# Patient Record
Sex: Female | Born: 2000 | Race: Black or African American | Hispanic: No | Marital: Single | Smoking: Former smoker
Health system: Southern US, Community
[De-identification: ages and names within clinical notes are randomized; demographics above are authoritative.]

## PROBLEM LIST (undated history)

## (undated) ENCOUNTER — Inpatient Hospital Stay (HOSPITAL_COMMUNITY): Payer: Self-pay

## (undated) ENCOUNTER — Emergency Department (HOSPITAL_COMMUNITY): Admission: EM | Payer: Medicaid Other | Source: Home / Self Care

## (undated) DIAGNOSIS — F329 Major depressive disorder, single episode, unspecified: Secondary | ICD-10-CM

## (undated) DIAGNOSIS — R87629 Unspecified abnormal cytological findings in specimens from vagina: Secondary | ICD-10-CM

## (undated) DIAGNOSIS — F32A Depression, unspecified: Secondary | ICD-10-CM

## (undated) HISTORY — PX: NO PAST SURGERIES: SHX2092

---
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
## 2018-05-09 ENCOUNTER — Other Ambulatory Visit: Payer: Self-pay

## 2018-05-09 ENCOUNTER — Encounter (HOSPITAL_COMMUNITY): Payer: Self-pay | Admitting: *Deleted

## 2018-05-09 ENCOUNTER — Inpatient Hospital Stay (HOSPITAL_COMMUNITY)
Admission: AD | Admit: 2018-05-09 | Discharge: 2018-05-09 | Disposition: A | Discharge status: Home / Self Care | Payer: Medicaid Other | Source: Ambulatory Visit | Attending: Family Medicine | Admitting: Family Medicine

## 2018-05-09 DIAGNOSIS — R309 Painful micturition, unspecified: Secondary | ICD-10-CM | POA: Diagnosis present

## 2018-05-09 DIAGNOSIS — N3001 Acute cystitis with hematuria: Secondary | ICD-10-CM | POA: Diagnosis not present

## 2018-05-09 DIAGNOSIS — Z87891 Personal history of nicotine dependence: Secondary | ICD-10-CM | POA: Diagnosis not present

## 2018-05-09 DIAGNOSIS — R112 Nausea with vomiting, unspecified: Secondary | ICD-10-CM | POA: Diagnosis not present

## 2018-05-09 LAB — URINALYSIS, ROUTINE W REFLEX MICROSCOPIC
Bilirubin Urine: NEGATIVE
GLUCOSE, UA: NEGATIVE mg/dL
KETONES UR: 20 mg/dL — AB
Nitrite: NEGATIVE
PROTEIN: 30 mg/dL — AB
Specific Gravity, Urine: 1.031 — ABNORMAL HIGH (ref 1.005–1.030)
pH: 5 (ref 5.0–8.0)

## 2018-05-09 LAB — POCT PREGNANCY, URINE: Preg Test, Ur: NEGATIVE

## 2018-05-09 MED ORDER — NITROFURANTOIN MONOHYD MACRO 100 MG PO CAPS: 100.0000 mg | ORAL_CAPSULE | Freq: Two times a day (BID) | ORAL | 0 refills | Status: DC

## 2018-05-09 MED ORDER — PHENAZOPYRIDINE HCL 200 MG PO TABS: 200.0000 mg | ORAL_TABLET | Freq: Three times a day (TID) | ORAL | 0 refills | Status: DC

## 2018-05-09 NOTE — Discharge Instructions (Signed)
In late 2019, the [facility] be [facility] to [facility]. At that time, the MAU (Maternity Admissions Unit), where you are being seen today, will no longer take care of non-pregnant patients. We strongly encourage you to find a doctor's office before that time, so that you can be seen with any GYN concerns, like vaginal discharge, urinary tract infection, etc.. in a timely manner. ° °In order to make an office visit more convenient, the [facility] will be offering evening hours with same-day appointments, walk-in appointments and scheduled appointments available during this time. ° °[facility] Hours: °Monday - 8am - 7:30 pm with walk-in between 4pm- 7:30 pm °Tuesday - 8 am - 5 pm (starting 07/31/17 we will be open late and accepting walk-ins from 4pm - 7:30pm) °Wednesday - 8 am - 5 pm (starting 10/31/17 we will be open late and accepting walk-ins from 4pm - 7:30pm) °Thursday 8 am - 5 pm (starting 01/31/18 we will be open late and accepting walk-ins from 4pm - 7:30pm) °Friday 8 am - 5 pm ° °For an appointment please call the [facility] at [phone] ° °For urgent needs, [facility] is also available for management of urgent GYN complaints such as vaginal discharge or urinary tract infections. ° ° ° ° ° °Urinary Tract Infection, Adult ° °A urinary tract infection (UTI) is an infection of any part of the urinary tract. The urinary tract includes the kidneys, ureters, bladder, and urethra. These organs make, store, and get rid of urine in the body. °Your health care provider may use other names to describe the infection. An upper UTI affects the ureters and kidneys (pyelonephritis). A lower UTI affects the bladder (cystitis) and urethra (urethritis). °What are the causes? °Most urinary tract infections are caused by bacteria in your genital area, around the entrance to your urinary  tract (urethra). These bacteria grow and cause inflammation of your urinary tract. °What increases the risk? °You are more likely to develop this condition if: °· You have a urinary catheter that stays in place (indwelling). °· You are not able to control when you urinate or have a bowel movement (you have incontinence). °· You are female and you: °? Use a spermicide or diaphragm for birth control. °? Have low estrogen levels. °? Are pregnant. °· You have certain genes that increase your risk (genetics). °· You are sexually active. °· You take antibiotic medicines. °· You have a condition that causes your flow of urine to slow down, such as: °? An enlarged prostate, if you are female. °? Blockage in your urethra (stricture). °? A kidney stone. °? A nerve condition that affects your bladder control (neurogenic bladder). °? Not getting enough to drink, or not urinating often. °· You have certain medical conditions, such as: °? Diabetes. °? A weak disease-fighting system (immunesystem). °? Sickle cell disease. °? Gout. °? Spinal cord injury. °What are the signs or symptoms? °Symptoms of this condition include: °· Needing to urinate right away (urgently). °· Frequent urination or passing small amounts of urine frequently. °· Pain or burning with urination. °· Blood in the urine. °· Urine that smells bad or unusual. °· Trouble urinating. °· Cloudy urine. °· Vaginal discharge, if you are female. °· Pain in the abdomen or the lower back. °You may also have: °· Vomiting or a decreased appetite. °· Confusion. °· Irritability or tiredness. °· A fever. °·   Diarrhea. °The first symptom in older adults may be confusion. In some cases, they may not have any symptoms until the infection has worsened. °How is this diagnosed? °This condition is diagnosed based on your medical history and a physical exam. You may also have other tests, including: °· Urine tests. °· Blood tests. °· Tests for sexually transmitted infections (STIs). °If  you have had more than one UTI, a cystoscopy or imaging studies may be done to determine the cause of the infections. °How is this treated? °Treatment for this condition includes: °· Antibiotic medicine. °· Over-the-counter medicines to treat discomfort. °· Drinking enough water to stay hydrated. °If you have frequent infections or have other conditions such as a kidney stone, you may need to see a health care provider who specializes in the urinary tract (urologist). °In rare cases, urinary tract infections can cause sepsis. Sepsis is a life-threatening condition that occurs when the body responds to an infection. Sepsis is treated in the hospital with IV antibiotics, fluids, and other medicines. °Follow these instructions at home: ° °Medicines °· Take over-the-counter and prescription medicines only as told by your health care provider. °· If you were prescribed an antibiotic medicine, take it as told by your health care provider. Do not stop using the antibiotic even if you start to feel better. °General instructions °· Make sure you: °? Empty your bladder often and completely. Do not hold urine for long periods of time. °? Empty your bladder after sex. °? Wipe from front to back after a bowel movement if you are female. Use each tissue one time when you wipe. °· Drink enough fluid to keep your urine pale yellow. °· Keep all follow-up visits as told by your health care provider. This is important. °Contact a health care provider if: °· Your symptoms do not get better after 1-2 days. °· Your symptoms go away and then return. °Get help right away if you have: °· Severe pain in your back or your lower abdomen. °· A fever. °· Nausea or vomiting. °Summary °· A urinary tract infection (UTI) is an infection of any part of the urinary tract, which includes the kidneys, ureters, bladder, and urethra. °· Most urinary tract infections are caused by bacteria in your genital area, around the entrance to your urinary tract  (urethra). °· Treatment for this condition often includes antibiotic medicines. °· If you were prescribed an antibiotic medicine, take it as told by your health care provider. Do not stop using the antibiotic even if you start to feel better. °· Keep all follow-up visits as told by your health care provider. This is important. °This information is not intended to replace advice given to you by your health care provider. Make sure you discuss any questions you have with your health care provider. °Document Released: 01/25/2005 Document Revised: 10/25/2017 Document Reviewed: 10/25/2017 °Elsevier Interactive Patient Education © 2019 Elsevier Inc[employer] ° ° °

## 2018-05-09 NOTE — MAU Note (Signed)
Pt states she thinks she has a uti. Pt states every time she goes to the bathroom she experiences burning.   Pt states she started vomiting 2 days ago.   Pt reports nausea and losing her appetite.   Pt reports fever yesterday, but not today.

## 2018-05-09 NOTE — MAU Provider Note (Signed)
History     CSN: [id number]  Arrival date and time: 05/09/18 1606   First Provider Initiated Contact with Patient 05/09/18 1847      Chief Complaint  Patient presents with  . Urinary Tract Infection   HPI Nichole Finley is a 18 y.o. G1P1001 non pregnant female who presents with burning with urination. She states it has been ongoing for 2 days. She denies any vaginal bleeding or discharge. She also reports intermittent nausea and vomiting.  OB History    Gravida  1   Para  1   Term  1   Preterm      AB      Living  1     SAB      TAB      Ectopic      Multiple      Live Births  1           Past Medical History:  Diagnosis Date  . Depression   . Vaginal Pap smear, abnormal     Past Surgical History:  Procedure Laterality Date  . NO PAST SURGERIES      Family History  Problem Relation Age of Onset  . Heart disease Maternal Uncle   . Heart disease Maternal Grandfather     Social History   Tobacco Use  . Smoking status: Former Smoker    Last attempt to quit: 05/23/2017    Years since quitting: 0.9  . Smokeless tobacco: Never Used  . Tobacco comment: before pregnancy  Substance Use Topics  . Alcohol use: No  . Drug use: No    Allergies: No Known Allergies  Medications Prior to Admission  Medication Sig Dispense Refill Last Dose  . FLUoxetine (PROZAC) 10 MG tablet Take 1 tablet (10 mg total) by mouth daily. 30 tablet 0   . Prenatal Vit-Fe Fumarate-FA (PRENATAL MULTIVITAMIN) TABS tablet Take 1 tablet by mouth daily at 12 noon.   Past Week at Unknown time    Review of Systems  Constitutional: Negative.  Negative for fatigue and fever.  HENT: Negative.   Respiratory: Negative.  Negative for shortness of breath.   Cardiovascular: Negative.  Negative for chest pain.  Gastrointestinal: Positive for nausea and vomiting. Negative for abdominal pain, constipation and diarrhea.  Genitourinary: Positive for dysuria. Negative for vaginal bleeding and  vaginal discharge.  Neurological: Negative.  Negative for dizziness and headaches.   Physical Exam   Blood pressure 118/65, pulse 77, temperature 98.5 F (36.9 C), resp. rate 18, weight 88.5 kg, SpO2 99 %, unknown if currently breastfeeding.  Physical Exam  Nursing note and vitals reviewed. Constitutional: She is oriented to person, place, and time. She appears well-developed and well-nourished. No distress.  HENT:  Head: Normocephalic.  Eyes: Pupils are equal, round, and reactive to light.  Cardiovascular: Normal rate, regular rhythm and normal heart sounds.  Respiratory: Effort normal and breath sounds normal. No respiratory distress.  GI: Soft. Bowel sounds are normal. She exhibits no distension. There is no abdominal tenderness.  Neurological: She is alert and oriented to person, place, and time.  Skin: Skin is warm and dry.  Psychiatric: She has a normal mood and affect. Her behavior is normal. Judgment and thought content normal.    MAU Course  Procedures Results for orders placed or performed during the hospital encounter of 05/09/18 (from the past 24 hour(s))  Urinalysis, Routine w reflex microscopic     Status: Abnormal   Collection Time: 05/09/18  5:03 PM  Result Value Ref Range   Color, Urine AMBER (A) YELLOW   APPearance HAZY (A) CLEAR   Specific Gravity, Urine 1.031 (H) 1.005 - 1.030   pH 5.0 5.0 - 8.0   Glucose, UA NEGATIVE NEGATIVE mg/dL   Hgb urine dipstick SMALL (A) NEGATIVE   Bilirubin Urine NEGATIVE NEGATIVE   Ketones, ur 20 (A) NEGATIVE mg/dL   Protein, ur 30 (A) NEGATIVE mg/dL   Nitrite NEGATIVE NEGATIVE   Leukocytes, UA MODERATE (A) NEGATIVE   RBC / HPF 6-10 0 - 5 RBC/hpf   WBC, UA 21-50 0 - 5 WBC/hpf   Bacteria, UA RARE (A) NONE SEEN   Squamous Epithelial / LPF 0-5 0 - 5   Mucus PRESENT   Pregnancy, urine POC     Status: None   Collection Time: 05/09/18  5:07 PM  Result Value Ref Range   Preg Test, Ur NEGATIVE NEGATIVE   MDM UA, UPT No  episodes of vomiting in MAU  Offered patient pelvic exam and screening for STDs to evaluate causes of pain- patient declined  Assessment and Plan   1. Acute cystitis with hematuria    -Discharge home in stable condition -Rx for macrobid and pyridium sent to patient's pharmacy -Pyelonephritis precautions discussed -Patient advised to follow-up with gyn of choice for routine gyn care -Patient may return to MAU as needed or if her condition were to change or worsen  Rolm Bookbinder CNM 05/09/2018, 6:47 PM

## 2018-11-04 ENCOUNTER — Ambulatory Visit
Admission: EM | Admit: 2018-11-04 | Discharge: 2018-11-04 | Disposition: A | Discharge status: Home / Self Care | Payer: Medicaid Other | Attending: Physician Assistant | Admitting: Physician Assistant

## 2018-11-04 ENCOUNTER — Other Ambulatory Visit: Payer: Self-pay

## 2018-11-04 DIAGNOSIS — L02412 Cutaneous abscess of left axilla: Secondary | ICD-10-CM | POA: Diagnosis not present

## 2018-11-04 MED ORDER — CEPHALEXIN 500 MG PO CAPS: 500.0000 mg | ORAL_CAPSULE | Freq: Four times a day (QID) | ORAL | 0 refills | Status: DC

## 2018-11-04 NOTE — ED Triage Notes (Signed)
Pt c/o an abscess under lt arm x1wk

## 2018-11-04 NOTE — Discharge Instructions (Signed)
Start Keflex as directed. You can remove current dressing in 24 hours. Keep wound clean and dry. You can clean gently with soap and water. Do not soak area in water. Warm compress for at least 15-20 mins each time. Monitor for spreading redness, increased warmth, increased swelling, fever, follow up for reevaluation needed.

## 2018-11-04 NOTE — ED Provider Notes (Signed)
[facility]    CSN: [id number] Arrival date & time: 11/04/18  1611     History   Chief Complaint Chief Complaint  Patient presents with  . Abscess    HPI Nichole Finley is a 18 y.o. female.   18 year old female comes in for 1 week history of left axilla abscess. Abscess has been gradually enlarging without surrounding erythema, warmth. Denies fever, chills, night sweats. Does shave the area. Has tried warm compress and tried to apply salt. Denies history of same.      Past Medical History:  Diagnosis Date  . Depression   . Vaginal Pap smear, abnormal     Patient Active Problem List   Diagnosis Date Noted  . 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 Severe single current episode of major depressive disorder, without psychotic features ([place])   . MDD (major depressive disorder), single episode, moderate ([place]) 10/06/2014  . Attention deficit hyperactivity disorder (ADHD), combined type, severe 10/06/2014  . Oppositional defiant disorder 10/06/2014    Past Surgical History:  Procedure Laterality Date  . NO PAST SURGERIES      OB History    Gravida  1   Para  1   Term  1   Preterm      AB      Living  1     SAB      TAB      Ectopic      Multiple      Live Births  1            Home Medications    Prior to Admission medications   Medication Sig Start Date End Date Taking? Authorizing Provider  cephALEXin (KEFLEX) 500 MG capsule Take 1 capsule (500 mg total) by mouth 4 (four) times daily. 11/04/18   Ok Edwards, PA-C    Family History Family History  Problem Relation Age of Onset  . Heart disease Maternal Uncle   . Heart disease Maternal Grandfather     Social History Social History   Tobacco Use  . Smoking status: Former Smoker    Quit date: 05/23/2017    Years since quitting: 1.4  . Smokeless tobacco: Never Used  . Tobacco comment: before pregnancy  Substance Use Topics  . Alcohol use: No  . Drug use: No      Allergies   Patient has no known allergies.   Review of Systems Review of Systems  Reason unable to perform ROS: See HPI as above.     Physical Exam Triage Vital Signs ED Triage Vitals  Enc Vitals Group     BP 11/04/18 1618 124/75     Pulse Rate 11/04/18 1618 80     Resp 11/04/18 1618 18     Temp 11/04/18 1618 98.2 F (36.8 C)     Temp Source 11/04/18 1618 Oral     SpO2 11/04/18 1618 98 %     Weight --      Height --      Head Circumference --      Peak Flow --      Pain Score 11/04/18 1619 9     Pain Loc --      Pain Edu? --      Excl. in [place]? --    No data found.  Updated Vital Signs BP 124/75 (BP Location: Left Arm)   Pulse 80   Temp 98.2 F (36.8 C) (Oral)   Resp 18   LMP  10/12/2018   SpO2 98%   Physical Exam Constitutional:      General: She is not in acute distress.    Appearance: She is well-developed. She is not diaphoretic.  HENT:     Head: Normocephalic and atraumatic.  Eyes:     Conjunctiva/sclera: Conjunctivae normal.     Pupils: Pupils are equal, round, and reactive to light.  Skin:    General: Skin is warm and dry.     Comments: 4cm x 4cm abscess without surrounding cellulitis. Fluctuance felt. No induration. Tenderness to palpation.   Neurological:     Mental Status: She is alert and oriented to person, place, and time.    UC Treatments / Results  Labs (all labs ordered are listed, but only abnormal results are displayed) Labs Reviewed - No data to display  EKG   Radiology No results found.  Procedures Incision and Drainage  Date/Time: 11/04/2018 5:50 PM Performed by: Belinda FisherYu, Avia Merkley V, PA-C Authorized by: Belinda FisherYu, Reganne Messerschmidt V, PA-C   Consent:    Consent obtained:  Verbal   Consent given by:  Patient   Risks discussed:  Bleeding, incomplete drainage, pain, infection and damage to other organs   Alternatives discussed:  Alternative treatment and referral Location:    Type:  Abscess   Size:  4cm x 4cm   Location:  Upper extremity   Upper  extremity location: axilla. Pre-procedure details:    Skin preparation:  Hibiclens Anesthesia (see MAR for exact dosages):    Anesthesia method:  Local infiltration   Local anesthetic:  Lidocaine 2% w/o epi Procedure type:    Complexity:  Simple Procedure details:    Needle aspiration: no     Incision types:  Single straight   Incision depth:  Subcutaneous   Scalpel blade:  11   Wound management:  Probed and deloculated and irrigated with saline   Drainage:  Purulent   Drainage amount:  Copious   Wound treatment:  Wound left open   Packing materials:  None Post-procedure details:    Patient tolerance of procedure:  Tolerated well, no immediate complications   (including critical care time)  Medications Ordered in UC Medications - No data to display  Initial Impression / Assessment and Plan / UC Course  I have reviewed the triage vital signs and the nursing notes.  Pertinent labs & imaging results that were available during my care of the patient were reviewed by me and considered in my medical decision making (see chart for details).    Patient tolerated procedure well. Though no surrounding cellulitis, still with significant swelling with ? of incomplete drainage. Will cover with keflex. Wound care instructions given. Return precautions given. Patient expresses understanding and agrees to plan.   Final Clinical Impressions(s) / UC Diagnoses   Final diagnoses:  Abscess of left axilla    ED Prescriptions    Medication Sig Dispense Auth. Provider   cephALEXin (KEFLEX) 500 MG capsule Take 1 capsule (500 mg total) by mouth 4 (four) times daily. 28 capsule Threasa AlphaYu, Ryatt Corsino V, PA-C        Shayra Anton V, [place]PA-C 11/04/18 1752

## 2019-03-04 ENCOUNTER — Ambulatory Visit
Admission: EM | Admit: 2019-03-04 | Discharge: 2019-03-04 | Disposition: A | Discharge status: Home / Self Care | Payer: Medicaid Other | Attending: Emergency Medicine | Admitting: Emergency Medicine

## 2019-03-04 ENCOUNTER — Encounter: Payer: Self-pay | Admitting: Emergency Medicine

## 2019-03-04 ENCOUNTER — Other Ambulatory Visit: Payer: Self-pay

## 2019-03-04 DIAGNOSIS — R300 Dysuria: Secondary | ICD-10-CM | POA: Insufficient documentation

## 2019-03-04 DIAGNOSIS — Z8619 Personal history of other infectious and parasitic diseases: Secondary | ICD-10-CM | POA: Insufficient documentation

## 2019-03-04 LAB — POCT URINALYSIS DIP (MANUAL ENTRY)
Bilirubin, UA: NEGATIVE
Blood, UA: NEGATIVE
Glucose, UA: NEGATIVE mg/dL
Ketones, POC UA: NEGATIVE mg/dL
Leukocytes, UA: NEGATIVE
Nitrite, UA: NEGATIVE
Spec Grav, UA: >=1.03 — AB (ref 1.010–1.025)
Urobilinogen, UA: 1 E.U./dL
pH, UA: 6 (ref 5.0–8.0)

## 2019-03-04 LAB — POCT URINE PREGNANCY: Preg Test, Ur: NEGATIVE

## 2019-03-04 NOTE — ED Provider Notes (Signed)
[facility]    CSN: [id number] Arrival date & time: 03/04/19  1358      History   Chief Complaint Chief Complaint  Patient presents with  . APPT: 2:00  . Dysuria    HPI Nichole Finley is a 18 y.o. female presenting for burning with urination for the last 2 days that is intermittent.  Has not tried thing for symptoms.  Of note, patient was treated for trichomonas 2 months ago, though her boyfriend never seek treatment.  States her boyfriend is getting tested/treated today.  Patient denied vaginal discharge, irritation, pelvic pain, fever.  Patient does endorse history of renal calculi: Denies back pain, change in urinary habit, hematuria.    Past Medical History:  Diagnosis Date  . Depression   . Vaginal Pap smear, abnormal     Patient Active Problem List   Diagnosis Date Noted  . 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 Severe single current episode of major depressive disorder, without psychotic features ([place])   . MDD (major depressive disorder), single episode, moderate ([place]) 10/06/2014  . Attention deficit hyperactivity disorder (ADHD), combined type, severe 10/06/2014  . Oppositional defiant disorder 10/06/2014    Past Surgical History:  Procedure Laterality Date  . NO PAST SURGERIES      OB History    Gravida  1   Para  1   Term  1   Preterm      AB      Living  1     SAB      TAB      Ectopic      Multiple      Live Births  1            Home Medications    Prior to Admission medications   Not on File    Family History Family History  Problem Relation Age of Onset  . Heart disease Maternal Uncle   . Heart disease Maternal Grandfather     Social History Social History   Tobacco Use  . Smoking status: Former Smoker    Quit date: 05/23/2017    Years since quitting: 1.7  . Smokeless tobacco: Never Used  . Tobacco comment: before pregnancy  Substance Use Topics  . Alcohol use: No  . Drug use: No      Allergies   Patient has no known allergies.   Review of Systems Review of Systems  Constitutional: Negative for fatigue and fever.  Respiratory: Negative for cough and shortness of breath.   Cardiovascular: Negative for chest pain and palpitations.  Gastrointestinal: Negative for constipation and diarrhea.  Genitourinary: Positive for dysuria. Negative for flank pain, frequency, hematuria, pelvic pain, urgency, vaginal bleeding, vaginal discharge and vaginal pain.     Physical Exam Triage Vital Signs ED Triage Vitals  Enc Vitals Group     BP 03/04/19 1404 118/68     Pulse Rate 03/04/19 1404 85     Resp 03/04/19 1404 18     Temp 03/04/19 1404 98.4 F (36.9 C)     Temp Source 03/04/19 1404 Temporal     SpO2 03/04/19 1404 98 %     Weight --      Height --      Head Circumference --      Peak Flow --      Pain Score 03/04/19 1406 0     Pain Loc --      Pain Edu? --      Excl.  in GC? --    No data found.  Updated Vital Signs BP 118/68 (BP Location: Left Arm)   Pulse 85   Temp 98.4 F (36.9 C) (Temporal)   Resp 18   LMP 02/18/2019   SpO2 98%   Visual Acuity Right Eye Distance:   Left Eye Distance:   Bilateral Distance:    Right Eye Near:   Left Eye Near:    Bilateral Near:     Physical Exam Constitutional:      General: She is not in acute distress. HENT:     Head: Normocephalic and atraumatic.  Eyes:     General: No scleral icterus.    Pupils: Pupils are equal, round, and reactive to light.  Cardiovascular:     Rate and Rhythm: Normal rate.  Pulmonary:     Effort: Pulmonary effort is normal.  Abdominal:     General: Bowel sounds are normal.     Palpations: Abdomen is soft.     Tenderness: There is no abdominal tenderness. There is no right CVA tenderness, left CVA tenderness or guarding.  Genitourinary:    Comments: Patient declined, self-swab performed Skin:    Coloration: Skin is not jaundiced or pale.  Neurological:     Mental Status: She is  alert and oriented to person, place, and time.      UC Treatments / Results  Labs (all labs ordered are listed, but only abnormal results are displayed) Labs Reviewed  POCT URINALYSIS DIP (MANUAL ENTRY) - Abnormal; Notable for the following components:      Result Value   Spec Grav, UA >=1.030 (*)    Protein Ur, POC trace (*)    All other components within normal limits  POCT URINE PREGNANCY  CERVICOVAGINAL ANCILLARY ONLY    EKG   Radiology No results found.  Procedures Procedures (including critical care time)  Medications Ordered in UC Medications - No data to display  Initial Impression / Assessment and Plan / UC Course  I have reviewed the triage vital signs and the nursing notes.  Pertinent labs & imaging results that were available during my care of the patient were reviewed by me and considered in my medical decision making (see chart for details).     STD testing pending: We will defer treatment as patient is already completed treatment, partner asymptomatic, and she was recently treated.  Urinalysis done in office, reviewed me at time of appointment: Positive for trace protein, otherwise unremarkable.  Culture deferred.  Reviewed findings with patient who verbalized understanding.  Will increase oral hydration, monitor symptoms.  Return precautions discussed, patient verbalized understanding and is agreeable to plan. Final Clinical Impressions(s) / UC Diagnoses   Final diagnoses:  History of trichomoniasis  Dysuria     Discharge Instructions     No evidence of UTI today. We will call you with results of your STD tests, and treat.  Important to avoid all forms of sexual intercourse (oral, vaginal, anal) with any/all partners for the next 7 days to avoid spreading/reinfecting. Any/all sexual partners should be notified of testing/treatment today.  Return for persistent/worsening symptoms or if you develop fever, abdominal or pelvic pain, blood in your  urine, or are re-exposed to an STI.    ED Prescriptions    None     PDMP not reviewed this encounter.   Hall[place], [place], [place] 03/04/19 1451

## 2019-03-04 NOTE — ED Notes (Signed)
Patient able to ambulate independently  

## 2019-03-04 NOTE — ED Triage Notes (Signed)
Pt presents to [facility] for assessment of burning with urination x 2 days.  Also states she tested positive for Trich 2 months ago, and was treated, but her boyfriend never got treated, so he is getting tested and treated today as well.

## 2019-03-04 NOTE — Discharge Instructions (Signed)
No evidence of UTI today. We will call you with results of your STD tests, and treat.  Important to avoid all forms of sexual intercourse (oral, vaginal, anal) with any/all partners for the next 7 days to avoid spreading/reinfecting. Any/all sexual partners should be notified of testing/treatment today.  Return for persistent/worsening symptoms or if you develop fever, abdominal or pelvic pain, blood in your urine, or are re-exposed to an STI.

## 2019-03-06 LAB — CERVICOVAGINAL ANCILLARY ONLY
Chlamydia: NEGATIVE
Neisseria Gonorrhea: NEGATIVE
Trichomonas: NEGATIVE

## 2019-04-15 IMAGING — US US OB TRANSVAGINAL
1 series · 15 of 28 positions shown · non-contrast
Comparison: 02/23/2017

CLINICAL DATA: Pregnant, inconclusive viability

EXAM:
TRANSVAGINAL OB ULTRASOUND
TECHNIQUE: Transvaginal ultrasound was performed for complete evaluation of the
gestation as well as the maternal uterus, adnexal regions, and
pelvic cul-de-sac.

[Series 1: us ob transvaginal · 65 acquisitions, 15 frames shown]
[im 1/65]
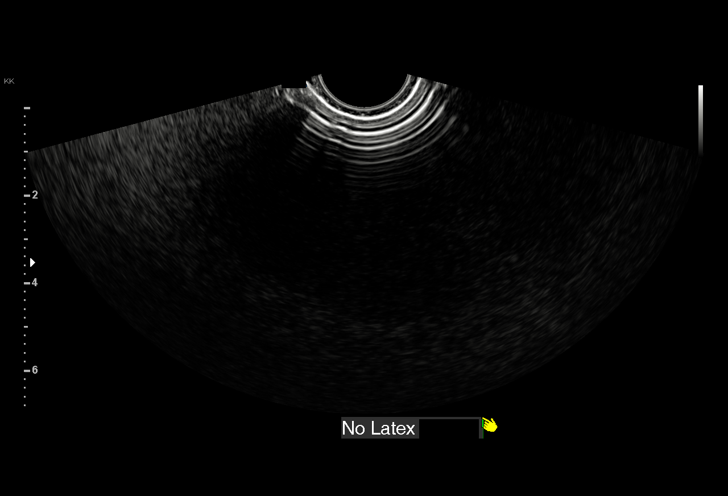
[im 5/65]
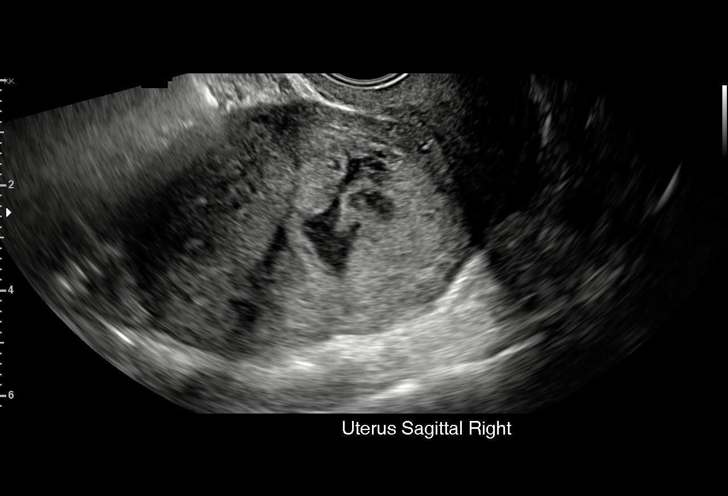
[im 10/65]
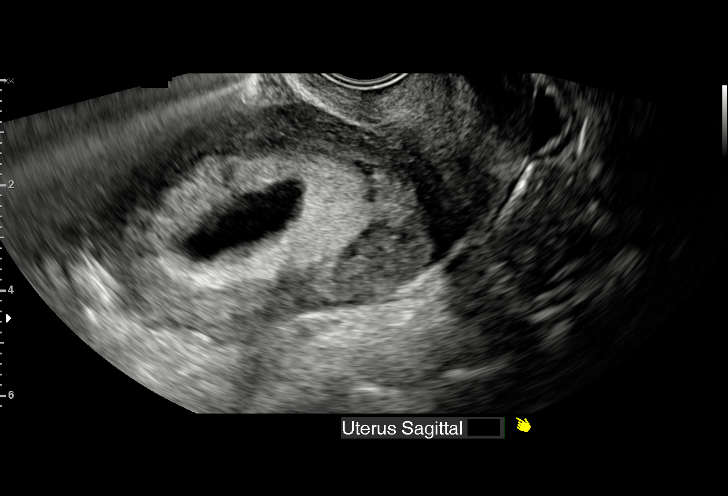
[im 15/65]
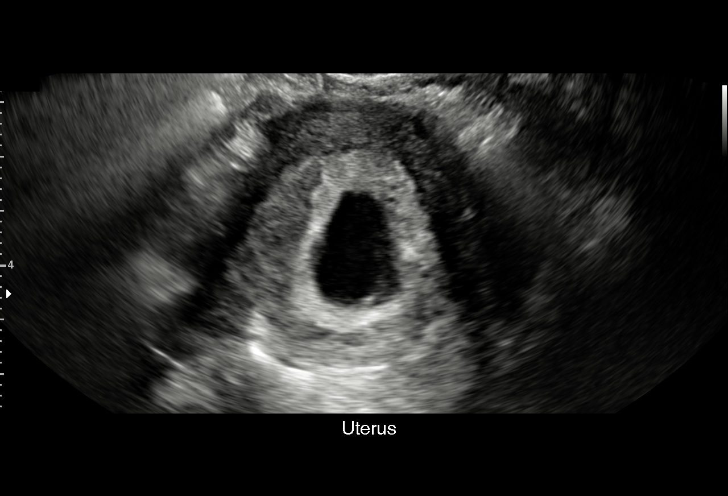
[im 19/65]
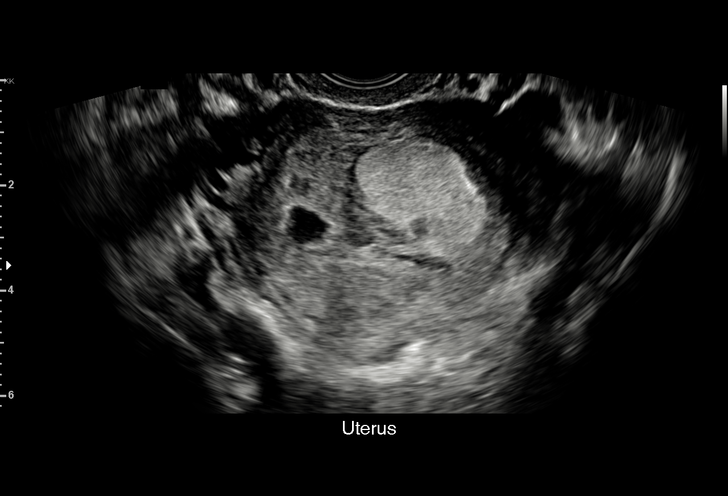
[im 24/65]
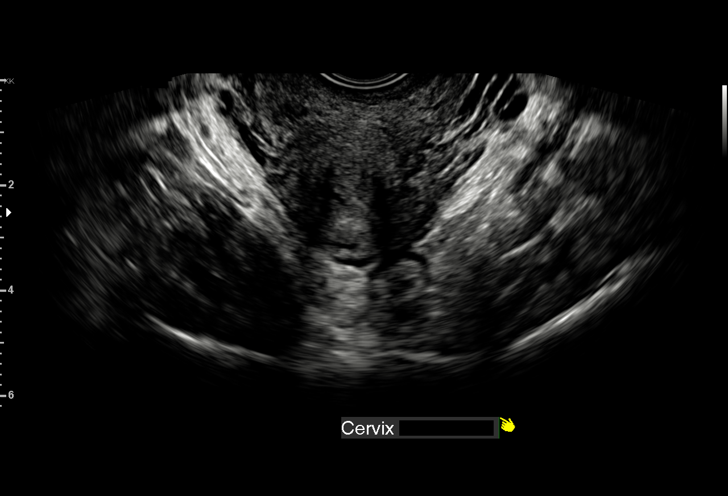
[im 29/65]
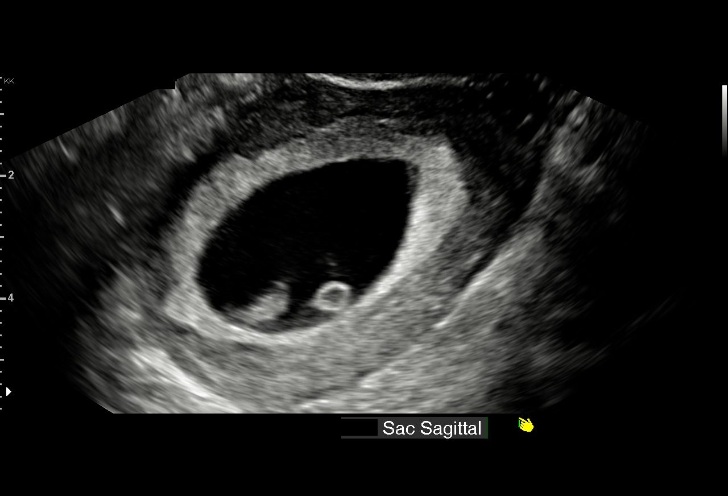
[im 34/65]
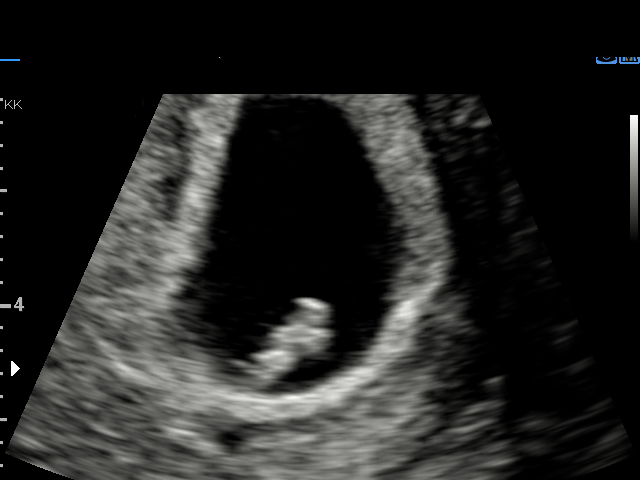
[im 36/65]
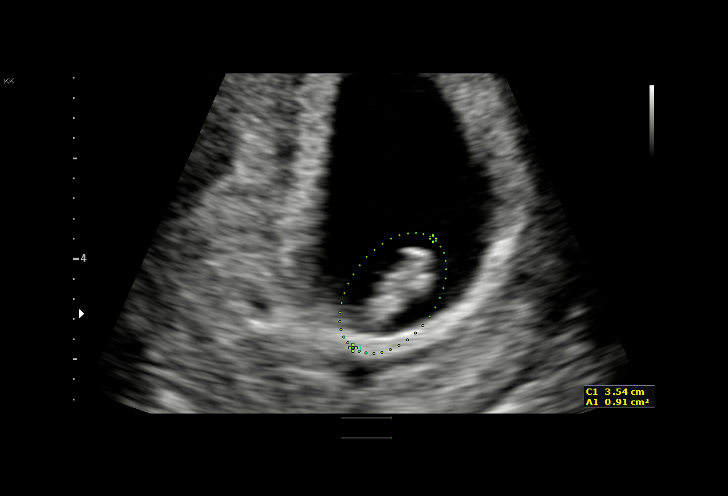
[im 41/65]
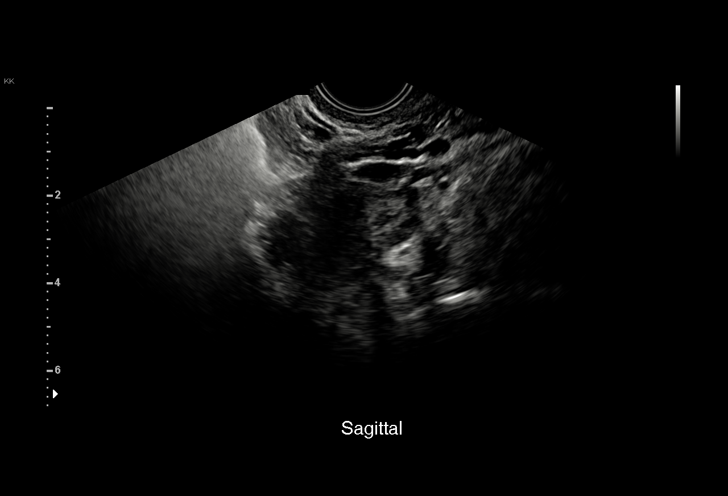
[im 46/65]
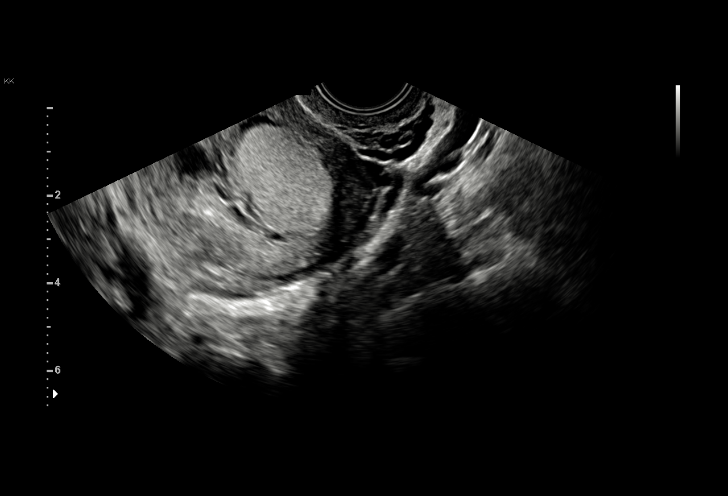
[im 50/65]
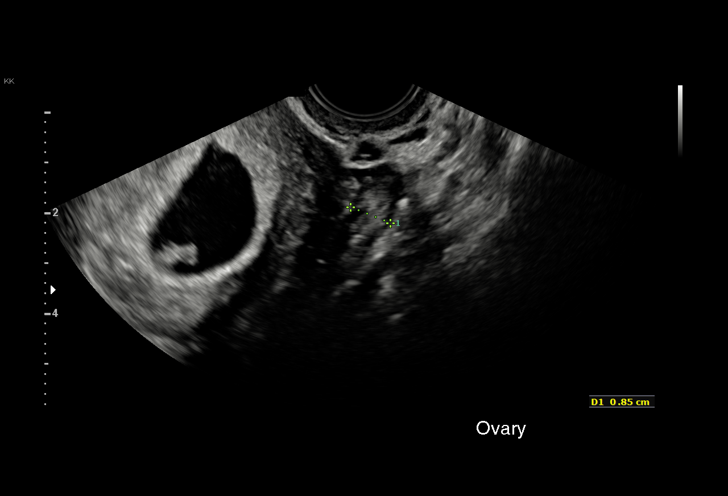
[im 55/65]
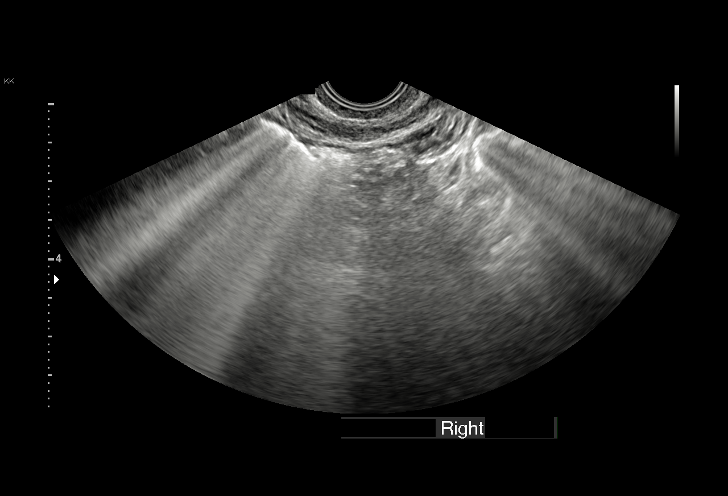
[im 60/65]
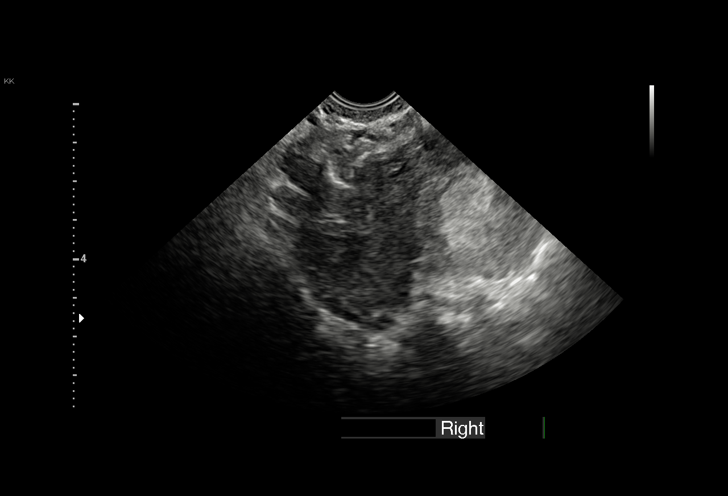
[im 65/65]
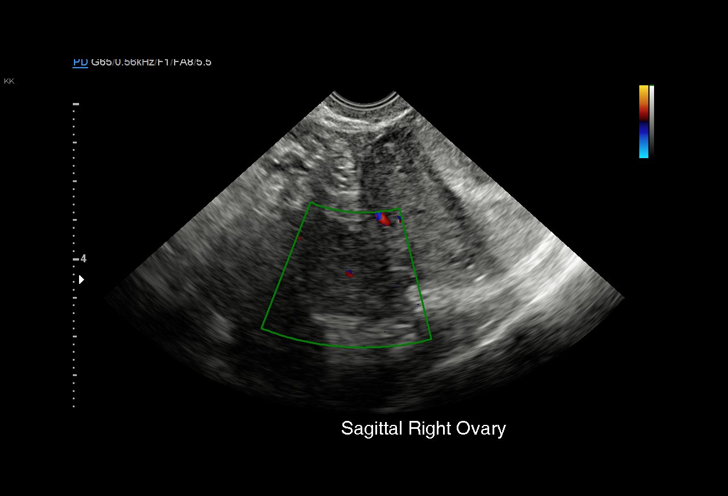

[15 of 28 positions shown; findings below may reference images not displayed]

FINDINGS: Intrauterine gestational sac: Single

Yolk sac:  Visualized.

Embryo:  Visualized.

Cardiac Activity: Visualized.

Heart Rate: 138 bpm

CRL:   10.2  mm   7 w 1 d                  US EDC: 10/29/2017

Subchorionic hemorrhage:  Large subchronic hemorrhage.

Maternal uterus/adnexae: Bilateral ovaries are within normal limits.

Trace pelvic fluid.
IMPRESSION: Single live intrauterine gestation, measuring 7 weeks 1 day by
crown-rump length, as described above.

Large subchronic hemorrhage.

## 2019-07-01 ENCOUNTER — Encounter (HOSPITAL_COMMUNITY): Payer: Self-pay

## 2019-07-01 ENCOUNTER — Emergency Department (HOSPITAL_COMMUNITY)
Admission: EM | Admit: 2019-07-01 | Discharge: 2019-07-02 | Disposition: A | Discharge status: Home / Self Care | Payer: Medicaid Other | Attending: Emergency Medicine | Admitting: Emergency Medicine

## 2019-07-01 ENCOUNTER — Emergency Department (HOSPITAL_COMMUNITY): Payer: Medicaid Other

## 2019-07-01 ENCOUNTER — Other Ambulatory Visit: Payer: Self-pay

## 2019-07-01 DIAGNOSIS — S20319A Abrasion of unspecified front wall of thorax, initial encounter: Secondary | ICD-10-CM | POA: Diagnosis not present

## 2019-07-01 DIAGNOSIS — Y93I9 Activity, other involving external motion: Secondary | ICD-10-CM | POA: Diagnosis not present

## 2019-07-01 DIAGNOSIS — Y999 Unspecified external cause status: Secondary | ICD-10-CM | POA: Insufficient documentation

## 2019-07-01 DIAGNOSIS — Z87891 Personal history of nicotine dependence: Secondary | ICD-10-CM | POA: Insufficient documentation

## 2019-07-01 DIAGNOSIS — Y92410 Unspecified street and highway as the place of occurrence of the external cause: Secondary | ICD-10-CM | POA: Insufficient documentation

## 2019-07-01 DIAGNOSIS — S299XXA Unspecified injury of thorax, initial encounter: Secondary | ICD-10-CM | POA: Diagnosis present

## 2019-07-01 LAB — PREGNANCY, URINE: Preg Test, Ur: NEGATIVE

## 2019-07-01 MED ORDER — IBUPROFEN 600 MG PO TABS: 600.0000 mg | ORAL_TABLET | Freq: Four times a day (QID) | ORAL | 0 refills | Status: DC | PRN

## 2019-07-01 MED ORDER — ACETAMINOPHEN 325 MG PO TABS
650.0000 mg | ORAL_TABLET | Freq: Once | ORAL | Status: AC
  Administered 2019-07-01: 650 mg via ORAL
  Filled 2019-07-01: qty 2
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
## 2019-07-23 ENCOUNTER — Ambulatory Visit
Admission: EM | Admit: 2019-07-23 | Discharge: 2019-07-23 | Disposition: A | Discharge status: Home / Self Care | Payer: Medicaid Other | Attending: Physician Assistant | Admitting: Physician Assistant

## 2019-07-23 DIAGNOSIS — N6310 Unspecified lump in the right breast, unspecified quadrant: Secondary | ICD-10-CM | POA: Insufficient documentation

## 2019-07-23 DIAGNOSIS — R399 Unspecified symptoms and signs involving the genitourinary system: Secondary | ICD-10-CM

## 2019-07-23 DIAGNOSIS — Z3202 Encounter for pregnancy test, result negative: Secondary | ICD-10-CM | POA: Diagnosis not present

## 2019-07-23 DIAGNOSIS — N760 Acute vaginitis: Secondary | ICD-10-CM | POA: Insufficient documentation

## 2019-07-23 DIAGNOSIS — R350 Frequency of micturition: Secondary | ICD-10-CM | POA: Insufficient documentation

## 2019-07-23 DIAGNOSIS — R300 Dysuria: Secondary | ICD-10-CM | POA: Diagnosis not present

## 2019-07-23 DIAGNOSIS — Z87891 Personal history of nicotine dependence: Secondary | ICD-10-CM | POA: Diagnosis not present

## 2019-07-23 DIAGNOSIS — N898 Other specified noninflammatory disorders of vagina: Secondary | ICD-10-CM | POA: Diagnosis not present

## 2019-07-23 DIAGNOSIS — Z803 Family history of malignant neoplasm of breast: Secondary | ICD-10-CM | POA: Diagnosis not present

## 2019-07-23 DIAGNOSIS — R3915 Urgency of urination: Secondary | ICD-10-CM

## 2019-07-23 LAB — POCT URINALYSIS DIP (MANUAL ENTRY)
Bilirubin, UA: NEGATIVE
Blood, UA: NEGATIVE
Glucose, UA: NEGATIVE mg/dL
Ketones, POC UA: NEGATIVE mg/dL
Nitrite, UA: NEGATIVE
Spec Grav, UA: 1.025 (ref 1.010–1.025)
Urobilinogen, UA: 0.2 E.U./dL
pH, UA: 7 (ref 5.0–8.0)

## 2019-07-23 LAB — POCT URINE PREGNANCY: Preg Test, Ur: NEGATIVE

## 2019-07-23 MED ORDER — FLUCONAZOLE 150 MG PO TABS: 150.0000 mg | ORAL_TABLET | Freq: Every day | ORAL | 0 refills | Status: DC

## 2019-07-23 NOTE — ED Provider Notes (Signed)
[facility]    CSN: [id number] Arrival date & time: 07/23/19  1408      History   Chief Complaint Chief Complaint  Patient presents with  . Urinary Tract Infection    HPI Nichole Finley is a 19 y.o. female.   19 year old female comes in for multiple complaints.  1. 3 day history of urinary symptoms, vaginal symptoms. Has had dysuria, urgency, frequency. States vulva also burns with urination. Having white chunky vaginal discharge. Denies hematuria. Denies abdominal pain, nausea, vomiting, diarrhea. Denies fever, chills, body aches.  Sexually active with one female partner, no condom use.  LMP 06/20/2019.  2.  1 month history of right breast mass.  Has not noticed any changes in size.  Denies pain, erythema, warmth.  Denies nipple discharge, skin changes.  Has family history of breast cancer including her maternal grandmother and maternal great-grandmother, unknown onset of age.     Past Medical History:  Diagnosis Date  . Depression   . Vaginal Pap smear, abnormal     Patient Active Problem List   Diagnosis Date Noted  . 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 Severe single current episode of major depressive disorder, without psychotic features ([facility])   . MDD (major depressive disorder), single episode, moderate ([facility]) 10/06/2014  . Attention deficit hyperactivity disorder (ADHD), combined type, severe 10/06/2014  . Oppositional defiant disorder 10/06/2014    Past Surgical History:  Procedure Laterality Date  . NO PAST SURGERIES      OB History    Gravida  1   Para  1   Term  1   Preterm      AB      Living  1     SAB      TAB      Ectopic      Multiple      Live Births  1            Home Medications    Prior to Admission medications   Medication Sig Start Date End Date Taking? Authorizing Provider  Prenatal Vit-Fe Fumarate-FA (PRENATAL MULTIVITAMIN) TABS tablet Take 1 tablet by mouth daily at 12 noon.   Yes [provider]  fluconazole (DIFLUCAN) 150 MG tablet Take 1 tablet (150 mg total) by mouth daily. Take second dose 72 hours later if symptoms still persists. 07/23/19   Cathie Hoops, Layana Konkel V, PA-C  ibuprofen (ADVIL) 600 MG tablet Take 1 tablet (600 mg total) by mouth every 6 (six) hours as needed. 07/01/19   Derwood Kaplan, MD    Family History Family History  Problem Relation Age of Onset  . Heart disease Maternal Uncle   . Heart disease Maternal Grandfather     Social History Social History   Tobacco Use  . Smoking status: Former Smoker    Quit date: 05/23/2017    Years since quitting: 2.1  . Smokeless tobacco: Never Used  . Tobacco comment: before pregnancy  Substance Use Topics  . Alcohol use: No  . Drug use: No     Allergies   Patient has no known allergies.   Review of Systems Review of Systems  Reason unable to perform ROS: See HPI as above.     Physical Exam Triage Vital Signs ED Triage Vitals  Enc Vitals Group     BP 07/23/19 1413 128/72     Pulse Rate 07/23/19 1413 74     Resp 07/23/19 1413 16     Temp 07/23/19  1413 99.2 F (37.3 C)     Temp Source 07/23/19 1413 Oral     SpO2 07/23/19 1413 98 %     Weight --      Height --      Head Circumference --      Peak Flow --      Pain Score 07/23/19 1422 0     Pain Loc --      Pain Edu? --      Excl. in GC? --    No data found.  Updated Vital Signs BP 128/72 (BP Location: Left Arm)   Pulse 74   Temp 99.2 F (37.3 C) (Oral)   Resp 16   LMP 06/20/2019   SpO2 98%   Breastfeeding No   Visual Acuity Right Eye Distance:   Left Eye Distance:   Bilateral Distance:    Right Eye Near:   Left Eye Near:    Bilateral Near:     Physical Exam Constitutional:      General: She is not in acute distress.    Appearance: She is well-developed. She is not ill-appearing, toxic-appearing or diaphoretic.  HENT:     Head: Normocephalic and atraumatic.  Eyes:     Conjunctiva/sclera: Conjunctivae normal.     Pupils:  Pupils are equal, round, and reactive to light.  Cardiovascular:     Rate and Rhythm: Normal rate and regular rhythm.  Pulmonary:     Effort: Pulmonary effort is normal. No respiratory distress.     Comments: LCTAB Chest:     Breasts: Breasts are symmetrical.        Right: No inverted nipple, nipple discharge or skin change.        Left: No inverted nipple, nipple discharge or skin change.    Abdominal:     General: Bowel sounds are normal.     Palpations: Abdomen is soft.     Tenderness: There is no abdominal tenderness. There is no right CVA tenderness, left CVA tenderness, guarding or rebound.  Musculoskeletal:     Cervical back: Normal range of motion and neck supple.  Lymphadenopathy:     Upper Body:     Right upper body: No supraclavicular, axillary or pectoral adenopathy.  Skin:    General: Skin is warm and dry.  Neurological:     Mental Status: She is alert and oriented to person, place, and time.  Psychiatric:        Behavior: Behavior normal.        Judgment: Judgment normal.      UC Treatments / Results  Labs (all labs ordered are listed, but only abnormal results are displayed) Labs Reviewed  POCT URINALYSIS DIP (MANUAL ENTRY) - Abnormal; Notable for the following components:      Result Value   Protein Ur, POC trace (*)    Leukocytes, UA Trace (*)    All other components within normal limits  URINE CULTURE  POCT URINE PREGNANCY  CERVICOVAGINAL ANCILLARY ONLY    EKG   Radiology No results found.  Procedures Procedures (including critical care time)  Medications Ordered in UC Medications - No data to display  Initial Impression / Assessment and Plan / UC Course  I have reviewed the triage vital signs and the nursing notes.  Pertinent labs & imaging results that were available during my care of the patient were reviewed by me and considered in my medical decision making (see chart for details).    1. Vaginitis/urinary symptoms. Urine with  trace leukocytes, will send for urine culture prior to treatment. Will cover for yeast empirically. Cytology sent, patient will be contacted with any positive results that require additional treatment. Patient to refrain from sexual activity for the next 7 days. Return precautions given.   2. Right breast mass Order for testing sent. Patient will be contacted with appointment. To follow up with OBGYN for further evaluation. Return precautions give.  Final Clinical Impressions(s) / UC Diagnoses   Final diagnoses:  Vaginal itching  Vaginal discharge  Symptoms of urinary tract infection  Breast mass, right    ED Prescriptions    Medication Sig Dispense Auth. Provider   fluconazole (DIFLUCAN) 150 MG tablet Take 1 tablet (150 mg total) by mouth daily. Take second dose 72 hours later if symptoms still persists. 2 tablet Ok Edwards, PA-C     PDMP not reviewed this encounter.   Ok Edwards, PA-C 07/23/19 1528

## 2019-07-23 NOTE — ED Triage Notes (Signed)
Pt c/o burning on urinating with urgency and frequency x3 days. C/o "chunky white" vaginal discharge with x2 days. Pt c/o a lump to rt breast x75month.

## 2019-07-23 NOTE — Discharge Instructions (Signed)
You were treated empirically for yeast, start diflucan as directed. Urine sent for culture. Cytology sent, you will be contacted with any positive results that requires further treatment. Refrain from sexual activity and alcohol use for the next 7 days. Monitor for any worsening of symptoms, fever, abdominal pain, nausea, vomiting, to follow up for reevaluation.  Testing ordered for breast mass, you will be contacted with appointment.

## 2019-07-24 LAB — CERVICOVAGINAL ANCILLARY ONLY
Chlamydia: NEGATIVE
Neisseria Gonorrhea: NEGATIVE
Trichomonas: NEGATIVE

## 2019-07-25 LAB — URINE CULTURE: Culture: >=100000 — AB

## 2019-11-17 ENCOUNTER — Ambulatory Visit (HOSPITAL_COMMUNITY): Payer: Self-pay

## 2019-12-26 ENCOUNTER — Ambulatory Visit: Payer: Medicaid Other | Admitting: Medical

## 2019-12-26 ENCOUNTER — Encounter: Payer: Self-pay | Admitting: *Deleted

## 2019-12-26 NOTE — Progress Notes (Signed)
Pt. [facility] gyn visit re: wanting to talk to provider about getting pregnant. Per discussion with provider do not need to call patient. She may be rescheduled if she calls.  Rafi Kenneth,RN

## 2020-01-02 ENCOUNTER — Emergency Department (HOSPITAL_COMMUNITY)
Admission: EM | Admit: 2020-01-02 | Discharge: 2020-01-03 | Disposition: A | Discharge status: Home / Self Care | Payer: Medicaid Other | Attending: Emergency Medicine | Admitting: Emergency Medicine

## 2020-01-02 ENCOUNTER — Encounter (HOSPITAL_COMMUNITY): Payer: Self-pay

## 2020-01-02 ENCOUNTER — Other Ambulatory Visit: Payer: Self-pay

## 2020-01-02 DIAGNOSIS — J029 Acute pharyngitis, unspecified: Secondary | ICD-10-CM | POA: Insufficient documentation

## 2020-01-02 DIAGNOSIS — U071 COVID-19: Secondary | ICD-10-CM | POA: Insufficient documentation

## 2020-01-02 DIAGNOSIS — Z5321 Procedure and treatment not carried out due to patient leaving prior to being seen by health care provider: Secondary | ICD-10-CM | POA: Insufficient documentation

## 2020-01-02 DIAGNOSIS — R1110 Vomiting, unspecified: Secondary | ICD-10-CM | POA: Insufficient documentation

## 2020-01-02 DIAGNOSIS — R519 Headache, unspecified: Secondary | ICD-10-CM | POA: Insufficient documentation

## 2020-01-02 DIAGNOSIS — M791 Myalgia, unspecified site: Secondary | ICD-10-CM | POA: Insufficient documentation

## 2020-01-02 LAB — SARS CORONAVIRUS 2 BY RT PCR (HOSPITAL ORDER, PERFORMED IN ~~LOC~~ HOSPITAL LAB): SARS Coronavirus 2: POSITIVE — AB

## 2020-01-02 NOTE — ED Triage Notes (Signed)
Pt's mom sent her here to have a covid test d/t headache, chills, nausea and body aches starting last night

## 2020-01-02 NOTE — ED Notes (Signed)
Pt states she is leaving due to wait time  

## 2020-01-06 ENCOUNTER — Emergency Department (HOSPITAL_COMMUNITY)
Admission: EM | Admit: 2020-01-06 | Discharge: 2020-01-06 | Disposition: A | Discharge status: Home / Self Care | Payer: Medicaid Other | Attending: Emergency Medicine | Admitting: Emergency Medicine

## 2020-01-06 ENCOUNTER — Encounter (HOSPITAL_COMMUNITY): Payer: Self-pay | Admitting: Obstetrics and Gynecology

## 2020-01-06 ENCOUNTER — Other Ambulatory Visit: Payer: Self-pay

## 2020-01-06 ENCOUNTER — Emergency Department (HOSPITAL_COMMUNITY): Payer: Medicaid Other

## 2020-01-06 DIAGNOSIS — R1110 Vomiting, unspecified: Secondary | ICD-10-CM | POA: Diagnosis not present

## 2020-01-06 DIAGNOSIS — U071 COVID-19: Secondary | ICD-10-CM | POA: Insufficient documentation

## 2020-01-06 DIAGNOSIS — Z5321 Procedure and treatment not carried out due to patient leaving prior to being seen by health care provider: Secondary | ICD-10-CM | POA: Insufficient documentation

## 2020-01-06 NOTE — ED Triage Notes (Signed)
Patient reports that her COVID sx have been worsening. Patient reports she is having SOB, loss of taste and smell, and emesis.

## 2020-01-06 NOTE — ED Notes (Signed)
Pt did not answer when called for a room. 

## 2020-06-07 ENCOUNTER — Emergency Department
Admission: RE | Admit: 2020-06-07 | Discharge: 2020-06-07 | Disposition: A | Discharge status: Home / Self Care | Payer: Medicaid Other | Source: Ambulatory Visit

## 2020-06-07 ENCOUNTER — Other Ambulatory Visit: Payer: Self-pay

## 2020-06-07 VITALS — BP 125/77 | HR 76 | Temp 97.7°F | Resp 16 | Ht 68.0 in | Wt 210.0 lb

## 2020-06-07 DIAGNOSIS — N926 Irregular menstruation, unspecified: Secondary | ICD-10-CM

## 2020-06-07 DIAGNOSIS — R519 Headache, unspecified: Secondary | ICD-10-CM

## 2020-06-07 DIAGNOSIS — R102 Pelvic and perineal pain: Secondary | ICD-10-CM

## 2020-06-07 LAB — POCT URINE PREGNANCY: Preg Test, Ur: NEGATIVE

## 2020-06-07 NOTE — Discharge Instructions (Signed)
°  Call to schedule an appointment with a primary care provider as well as gynecologist for further evaluation and treatment of your symptoms.

## 2020-06-07 NOTE — ED Provider Notes (Signed)
[facility]    CSN: [id number] Arrival date & time: 06/07/20  1250      History   Chief Complaint Chief Complaint  Patient presents with  . Possible Pregnancy    HPI Nichole Finley is a 20 y.o. female.   HPI  Nichole Finley is a 20 y.o. female presenting to UC with request for a pregnancy test. LMP: 05/14/20 but hx of irregular menses. She has had mild pelvic cramping for about 1 week. Usually when she has cramping, her period starts. No known hx of ovarian cysts or fibroids. Denies abdominal pain or back pain at this time. She has had generalized HA and nausea but no vomiting.  She did take a home pregnancy test last night and this morning, thought it may be positive. She is not on birth control, states she has been trying to conceive for about 1 year. She does have a 2yo son, denies having difficulty becoming pregnant with him. She does not have a PCP or GYN at this time. Denies concern for STIs.  One dose of Moderna COVID vaccine 05/2019, denies concern for COVID.    Past Medical History:  Diagnosis Date  . Depression   . Vaginal Pap smear, abnormal     Patient Active Problem List   Diagnosis Date Noted  . 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 Severe single current episode of major depressive disorder, without psychotic features (HCC)   . MDD (major depressive disorder), single episode, moderate (HCC) 10/06/2014  . Attention deficit hyperactivity disorder (ADHD), combined type, severe 10/06/2014  . Oppositional defiant disorder 10/06/2014    Past Surgical History:  Procedure Laterality Date  . NO PAST SURGERIES      OB History    Gravida  1   Para  1   Term  1   Preterm      AB      Living  1     SAB      IAB      Ectopic      Multiple      Live Births  1            Home Medications    Prior to Admission medications   Medication Sig Start Date End Date Taking? Authorizing Provider  fluconazole (DIFLUCAN) 150 MG tablet Take 1  tablet (150 mg total) by mouth daily. Take second dose 72 hours later if symptoms still persists. Patient not taking: Reported on 06/07/2020 07/23/19   Belinda Fisher, PA-C  ibuprofen (ADVIL) 600 MG tablet Take 1 tablet (600 mg total) by mouth every 6 (six) hours as needed. Patient not taking: Reported on 06/07/2020 07/01/19   Derwood Kaplan, MD  Prenatal Vit-Fe Fumarate-FA (PRENATAL MULTIVITAMIN) TABS tablet Take 1 tablet by mouth daily at 12 noon. Patient not taking: Reported on 06/07/2020    [provider]    Family History Family History  Problem Relation Age of Onset  . Heart disease Maternal Uncle   . Heart disease Maternal Grandfather     Social History Social History   Tobacco Use  . Smoking status: Former Smoker    Quit date: 05/23/2017    Years since quitting: 3.0  . Smokeless tobacco: Never Used  . Tobacco comment: before pregnancy  Vaping Use  . Vaping Use: Never used  Substance Use Topics  . Alcohol use: No  . Drug use: No     Allergies   Patient has no known allergies.   Review  of Systems Review of Systems  Constitutional: Negative for chills and fever.  HENT: Negative for congestion, ear pain, sore throat, trouble swallowing and voice change.   Respiratory: Negative for cough and shortness of breath.   Cardiovascular: Negative for chest pain and palpitations.  Gastrointestinal: Positive for nausea. Negative for abdominal pain, diarrhea and vomiting.  Genitourinary: Positive for menstrual problem (irregular). Negative for dysuria, flank pain, frequency, hematuria, vaginal bleeding, vaginal discharge and vaginal pain.  Musculoskeletal: Negative for arthralgias, back pain and myalgias.  Skin: Negative for rash.  Neurological: Positive for headaches. Negative for dizziness and light-headedness.  All other systems reviewed and are negative.    Physical Exam Triage Vital Signs ED Triage Vitals  Enc Vitals Group     BP 06/07/20 1331 125/77     Pulse Rate  06/07/20 1331 76     Resp 06/07/20 1331 16     Temp 06/07/20 1331 97.7 F (36.5 C)     Temp Source 06/07/20 1331 Oral     SpO2 06/07/20 1331 99 %     Weight 06/07/20 1337 210 lb (95.3 kg)     Height 06/07/20 1337 5\' 8"  (1.727 m)     Head Circumference --      Peak Flow --      Pain Score --      Pain Loc --      Pain Edu? --      Excl. in GC? --    No data found.  Updated Vital Signs BP 125/77 (BP Location: Right Arm)   Pulse 76   Temp 97.7 F (36.5 C) (Oral)   Resp 16   Ht 5\' 8"  (1.727 m)   Wt 210 lb (95.3 kg)   LMP 05/14/2020 (Exact Date)   SpO2 99%   BMI 31.93 kg/m   Visual Acuity Right Eye Distance:   Left Eye Distance:   Bilateral Distance:    Right Eye Near:   Left Eye Near:    Bilateral Near:     Physical Exam Vitals and nursing note reviewed.  Constitutional:      General: She is not in acute distress.    Appearance: Normal appearance. She is well-developed and well-nourished. She is not ill-appearing, toxic-appearing or diaphoretic.  HENT:     Head: Normocephalic and atraumatic.     Right Ear: Tympanic membrane and ear canal normal.     Left Ear: Tympanic membrane and ear canal normal.     Nose: Nose normal.     Mouth/Throat:     Mouth: Mucous membranes are moist.     Pharynx: Oropharynx is clear.  Eyes:     Extraocular Movements: EOM normal.  Cardiovascular:     Rate and Rhythm: Normal rate and regular rhythm.  Pulmonary:     Effort: Pulmonary effort is normal. No respiratory distress.     Breath sounds: Normal breath sounds. No stridor. No wheezing, rhonchi or rales.  Abdominal:     General: There is no distension.     Palpations: Abdomen is soft.     Tenderness: There is no abdominal tenderness. There is no right CVA tenderness or left CVA tenderness.  Musculoskeletal:        General: Normal range of motion.     Cervical back: Normal range of motion.  Skin:    General: Skin is warm and dry.  Neurological:     Mental Status: She is alert  and oriented to person, place, and time.  Psychiatric:  Mood and Affect: Mood and affect normal.        Behavior: Behavior normal.      UC Treatments / Results  Labs (all labs ordered are listed, but only abnormal results are displayed) Labs Reviewed  POCT URINE PREGNANCY    EKG   Radiology No results found.  Procedures Procedures (including critical care time)  Medications Ordered in UC Medications - No data to display  Initial Impression / Assessment and Plan / UC Course  I have reviewed the triage vital signs and the nursing notes.  Pertinent labs & imaging results that were available during my care of the patient were reviewed by me and considered in my medical decision making (see chart for details).     Normal exam including benign abdominal exam. Urine pregnancy: Negative Pt declined COVID and STI testing Encouraged establishing care with PCP and GYN for further evaluation of symptoms Resource guide provided Final Clinical Impressions(s) / UC Diagnoses   Final diagnoses:  Irregular periods  Pelvic cramping  Generalized headache     Discharge Instructions      Call to schedule an appointment with a primary care provider as well as gynecologist for further evaluation and treatment of your symptoms.     ED Prescriptions    None     PDMP not reviewed this encounter.   Lurene Shadow, [place] 06/07/20 [phone]

## 2020-06-07 NOTE — ED Triage Notes (Signed)
Last menses was 05/14/20 Pt took home pregnancy test last night and this am & thought it may be positive  Came today to take a test in UC COVID vaccine Moderna (only 1 dose) 05/2019 Headache & nausea - denies any bleeding C/O mild cramps

## 2020-09-10 ENCOUNTER — Other Ambulatory Visit: Payer: Self-pay

## 2020-09-10 ENCOUNTER — Ambulatory Visit
Admission: EM | Admit: 2020-09-10 | Discharge: 2020-09-10 | Disposition: A | Discharge status: Home / Self Care | Payer: Medicaid Other | Attending: Internal Medicine | Admitting: Internal Medicine

## 2020-09-10 DIAGNOSIS — R110 Nausea: Secondary | ICD-10-CM | POA: Insufficient documentation

## 2020-09-10 DIAGNOSIS — Z3202 Encounter for pregnancy test, result negative: Secondary | ICD-10-CM | POA: Insufficient documentation

## 2020-09-10 DIAGNOSIS — R109 Unspecified abdominal pain: Secondary | ICD-10-CM | POA: Insufficient documentation

## 2020-09-10 LAB — POCT URINALYSIS DIP (MANUAL ENTRY)
Bilirubin, UA: NEGATIVE
Blood, UA: NEGATIVE
Glucose, UA: NEGATIVE mg/dL
Leukocytes, UA: NEGATIVE
Nitrite, UA: POSITIVE — AB
Protein Ur, POC: 30 mg/dL — AB
Spec Grav, UA: >=1.03 — AB (ref 1.010–1.025)
Urobilinogen, UA: 0.2 E.U./dL
pH, UA: 6 (ref 5.0–8.0)

## 2020-09-10 LAB — POCT URINE PREGNANCY: Preg Test, Ur: NEGATIVE

## 2020-09-10 NOTE — ED Provider Notes (Signed)
[facility]    CSN: [id number] Arrival date & time: 09/10/20  1504      History   Chief Complaint Chief Complaint  Patient presents with  . Possible Pregnancy    HPI Nichole Finley is a 20 y.o. female presenting today for evaluation of possible pregnancy.  Patient reports that her last menstrual cycle was 08/23/2020.  Patient reports over the past week she has had lower abdominal cramping has felt more moody and emotional recently along with some nausea which began last night.  She reports similar sensations when she was pregnant previously.  She denies any urinary symptoms, denies any vaginal symptoms.   HPI  Past Medical History:  Diagnosis Date  . Depression   . Vaginal Pap smear, abnormal     Patient Active Problem List   Diagnosis Date Noted  . 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 Severe single current episode of major depressive disorder, without psychotic features (HCC)   . MDD (major depressive disorder), single episode, moderate (HCC) 10/06/2014  . Attention deficit hyperactivity disorder (ADHD), combined type, severe 10/06/2014  . Oppositional defiant disorder 10/06/2014    Past Surgical History:  Procedure Laterality Date  . NO PAST SURGERIES      OB History    Gravida  1   Para  1   Term  1   Preterm      AB      Living  1     SAB      IAB      Ectopic      Multiple      Live Births  1            Home Medications    Prior to Admission medications   Not on File    Family History Family History  Problem Relation Age of Onset  . Heart disease Maternal Uncle   . Heart disease Maternal Grandfather     Social History Social History   Tobacco Use  . Smoking status: Former Smoker    Quit date: 05/23/2017    Years since quitting: 3.3  . Smokeless tobacco: Never Used  . Tobacco comment: before pregnancy  Vaping Use  . Vaping Use: Never used  Substance Use Topics  . Alcohol use: No  . Drug use: No      Allergies   Patient has no known allergies.   Review of Systems Review of Systems  Constitutional: Negative for fever.  Respiratory: Negative for shortness of breath.   Cardiovascular: Negative for chest pain.  Gastrointestinal: Positive for abdominal pain. Negative for diarrhea, nausea and vomiting.  Genitourinary: Negative for dysuria, flank pain, genital sores, hematuria, menstrual problem, vaginal bleeding, vaginal discharge and vaginal pain.  Musculoskeletal: Negative for back pain.  Skin: Negative for rash.  Neurological: Negative for dizziness, light-headedness and headaches.     Physical Exam Triage Vital Signs ED Triage Vitals  Enc Vitals Group     BP 09/10/20 1539 132/78     Pulse Rate 09/10/20 1539 77     Resp 09/10/20 1539 16     Temp 09/10/20 1539 98.4 F (36.9 C)     Temp Source 09/10/20 1539 Oral     SpO2 09/10/20 1539 95 %     Weight --      Height --      Head Circumference --      Peak Flow --      Pain Score 09/10/20 1540 0  Pain Loc --      Pain Edu? --      Excl. in GC? --    No data found.  Updated Vital Signs BP 132/78 (BP Location: Right Arm)   Pulse 77   Temp 98.4 F (36.9 C) (Oral)   Resp 16   LMP 08/23/2020   SpO2 95%   Visual Acuity Right Eye Distance:   Left Eye Distance:   Bilateral Distance:    Right Eye Near:   Left Eye Near:    Bilateral Near:     Physical Exam Vitals and nursing note reviewed.  Constitutional:      Appearance: She is well-developed.     Comments: No acute distress  HENT:     Head: Normocephalic and atraumatic.     Nose: Nose normal.  Eyes:     Conjunctiva/sclera: Conjunctivae normal.  Cardiovascular:     Rate and Rhythm: Normal rate.  Pulmonary:     Effort: Pulmonary effort is normal. No respiratory distress.  Abdominal:     General: There is no distension.     Comments: Soft, nondistended, tender to palpation to bilateral lower quadrants, negative rebound, negative Rovsing, negative  McBurney's  Musculoskeletal:        General: Normal range of motion.     Cervical back: Neck supple.  Skin:    General: Skin is warm and dry.  Neurological:     Mental Status: She is alert and oriented to person, place, and time.      UC Treatments / Results  Labs (all labs ordered are listed, but only abnormal results are displayed) Labs Reviewed  POCT URINALYSIS DIP (MANUAL ENTRY) - Abnormal; Notable for the following components:      Result Value   Clarity, UA cloudy (*)    Ketones, POC UA trace (5) (*)    Spec Grav, UA >=1.030 (*)    Protein Ur, POC =30 (*)    Nitrite, UA Positive (*)    All other components within normal limits  URINE CULTURE  POCT URINE PREGNANCY  CERVICOVAGINAL ANCILLARY ONLY    EKG   Radiology No results found.  Procedures Procedures (including critical care time)  Medications Ordered in UC Medications - No data to display  Initial Impression / Assessment and Plan / UC Course  I have reviewed the triage vital signs and the nursing notes.  Pertinent labs & imaging results that were available during my care of the patient were reviewed by me and considered in my medical decision making (see chart for details).     UA does have positive nitrites, negative leukocytes, will send for culture along with vaginal swab to further evaluate for any UTI/vaginal infection contributing to cramping.  Pregnancy negative today, patient is approximately 2 weeks from prior menstrual cycle, discussed possible early results and recommended to repeat pregnancy test if not getting cycle normally.  Discussed strict return precautions. Patient verbalized understanding and is agreeable with plan.  Final Clinical Impressions(s) / UC Diagnoses   Final diagnoses:  Encounter for pregnancy test with result negative  Abdominal cramping  Nausea without vomiting     Discharge Instructions     Pregnancy negative Urine Culture and  Vaginal Swab pending to screen  for vaginal infections Tylenol for cramping as needed Repeat test in another 1-2 weeks Follow up if symptoms not improving or worsening    ED Prescriptions    None     PDMP not reviewed this encounter.   Charika Mikelson, [employer]  C, PA-C 09/11/20 0814

## 2020-09-10 NOTE — Discharge Instructions (Addendum)
Pregnancy negative Urine Culture and  Vaginal Swab pending to screen for vaginal infections Tylenol for cramping as needed Repeat test in another 1-2 weeks Follow up if symptoms not improving or worsening

## 2020-09-10 NOTE — ED Triage Notes (Signed)
Pt present possible pregnant, pt states she been having headaches and emotional and pressure  And cramps in her lowe stomach. Pt states last night she has been feeling nausea.

## 2020-09-12 LAB — URINE CULTURE

## 2020-09-13 ENCOUNTER — Ambulatory Visit: Payer: Medicaid Other | Admitting: Obstetrics & Gynecology

## 2020-09-13 LAB — CERVICOVAGINAL ANCILLARY ONLY
Bacterial Vaginitis (gardnerella): POSITIVE — AB
Candida Glabrata: NEGATIVE
Candida Vaginitis: NEGATIVE
Chlamydia: NEGATIVE
Comment: NEGATIVE
Comment: NEGATIVE
Comment: NEGATIVE
Comment: NEGATIVE
Comment: NEGATIVE
Comment: NORMAL
Neisseria Gonorrhea: NEGATIVE
Trichomonas: NEGATIVE

## 2020-09-16 ENCOUNTER — Telehealth (HOSPITAL_COMMUNITY): Payer: Self-pay | Admitting: Emergency Medicine

## 2020-09-16 MED ORDER — METRONIDAZOLE 500 MG PO TABS: 500.0000 mg | ORAL_TABLET | Freq: Two times a day (BID) | ORAL | 0 refills | Status: DC

## 2021-04-28 ENCOUNTER — Ambulatory Visit: Payer: Medicaid Other

## 2021-04-29 ENCOUNTER — Other Ambulatory Visit: Payer: Self-pay

## 2021-04-29 ENCOUNTER — Ambulatory Visit
Admission: EM | Admit: 2021-04-29 | Discharge: 2021-04-29 | Disposition: A | Discharge status: Home / Self Care | Payer: Medicaid Other | Attending: Student | Admitting: Student

## 2021-04-29 DIAGNOSIS — Z3201 Encounter for pregnancy test, result positive: Secondary | ICD-10-CM

## 2021-04-29 DIAGNOSIS — O210 Mild hyperemesis gravidarum: Secondary | ICD-10-CM

## 2021-04-29 DIAGNOSIS — O2311 Infections of bladder in pregnancy, first trimester: Secondary | ICD-10-CM

## 2021-04-29 DIAGNOSIS — R110 Nausea: Secondary | ICD-10-CM | POA: Diagnosis present

## 2021-04-29 DIAGNOSIS — N3001 Acute cystitis with hematuria: Secondary | ICD-10-CM | POA: Diagnosis not present

## 2021-04-29 DIAGNOSIS — Z3A01 Less than 8 weeks gestation of pregnancy: Secondary | ICD-10-CM

## 2021-04-29 LAB — POCT URINALYSIS DIP (MANUAL ENTRY)
Bilirubin, UA: NEGATIVE
Glucose, UA: NEGATIVE mg/dL
Nitrite, UA: POSITIVE — AB
Protein Ur, POC: 30 mg/dL — AB
Spec Grav, UA: >=1.03 — AB (ref 1.010–1.025)
Urobilinogen, UA: 1 E.U./dL
pH, UA: 6 (ref 5.0–8.0)

## 2021-04-29 LAB — POCT URINE PREGNANCY: Preg Test, Ur: POSITIVE — AB

## 2021-04-29 MED ORDER — CEPHALEXIN 500 MG PO CAPS: 500.0000 mg | ORAL_CAPSULE | Freq: Four times a day (QID) | ORAL | 0 refills | Status: DC

## 2021-04-29 MED ORDER — DOXYLAMINE-PYRIDOXINE 10-10 MG PO TBEC: 2.0000 | DELAYED_RELEASE_TABLET | ORAL | 0 refills | Status: DC

## 2021-04-29 NOTE — Discharge Instructions (Addendum)
-  Start the antibiotic: Keflex, 4x daily x5 days. You can take this with food if you have a sensitive stomach. -You are pregnant! Congrats!! -For your nausea: you can start the Doxylamine-pyridoxine. --2 tablets taken at bedtime (Day 1). If symptoms are controlled the following day, continue taking 2 tablets at bedtime. If symptoms persist in the afternoon of Day 2, take 2 tablets at bedtime and start taking 3 tablets on Day 3 (1 tablet in the morning and 2 tablets at bedtime). If symptoms are controlled on Day 4, continue taking 3 tablets daily. Otherwise, take 1 tablet in the morning, 1 tablet in the mid-afternoon, and 2 tablets at bedtime (total of 4 tablets).

## 2021-04-29 NOTE — ED Provider Notes (Signed)
[facility]    CSN: [id number] Arrival date & time: 04/29/21  1233      History   Chief Complaint Chief Complaint  Patient presents with   Possible Pregnancy    HPI Nichole [facility]ley is a 20 y.o. female presenting with positive pregnancy test, dysuria, nausea without vomiting.  Medical history noncontributory.  States her last menstrual period was about 12/1, though her periods are irregular.  Positive home pregnancy test.  Endorses some nausea without vomiting, requesting medication for this.  Also with dysuria and urinary frequency for about 2 days, without abdominal pain, flank pain, fever/chills, vaginal discharge.  Denies STI risk.  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   Severe single current episode of major depressive disorder, without psychotic features ([place])    MDD (major depressive disorder), single episode, moderate ([place]) 10/06/2014   Attention deficit hyperactivity disorder (ADHD), combined type, severe 10/06/2014   Oppositional defiant disorder 10/06/2014    Past Surgical History:  Procedure Laterality Date   NO PAST SURGERIES      OB History     Gravida  1   Para  1   Term  1   Preterm      AB      Living  1      SAB      IAB      Ectopic      Multiple      Live Births  1            Home Medications    Prior to Admission medications   Medication Sig Start Date End Date Taking? Authorizing Provider  cephALEXin (KEFLEX) 500 MG capsule Take 1 capsule (500 mg total) by mouth 4 (four) times daily. 04/29/21  Yes Hazel Sams, PA-C  Doxylamine-Pyridoxine 10-10 MG TBEC Take 2 tablets by mouth as directed. 2 tablets taken at bedtime (Day 1). If symptoms are controlled the following day, continue taking 2 tablets at bedtime. If symptoms persist in the afternoon of Day 2, take 2  tablets at bedtime and start taking 3 tablets on Day 3 (1 tablet in the morning and 2 tablets at bedtime). If symptoms are controlled on Day 4, continue taking 3 tablets daily. Otherwise, take 1 tablet in the morning, 1 tablet in the mid-afternoon, and 2 tablets at bedtime (total of 4 tablets). 04/29/21  Yes Hazel Sams, PA-C    Family History Family History  Problem Relation Age of Onset   Heart disease Maternal Uncle    Heart disease Maternal Grandfather     Social History Social History   Tobacco Use   Smoking status: Former    Types: Cigarettes    Quit date: 05/23/2017    Years since quitting: 3.9   Smokeless tobacco: Never   Tobacco comments:    before pregnancy  Vaping Use   Vaping Use: Never used  Substance Use Topics   Alcohol use: No   Drug use: No     Allergies   Patient has no known allergies.   Review of Systems Review of Systems  Constitutional:  Negative for appetite change, chills, diaphoresis and fever.  Respiratory:  Negative for shortness of breath.   Cardiovascular:  Negative for chest pain.  Gastrointestinal:  Negative for abdominal pain, blood in stool, constipation, diarrhea,  nausea and vomiting.  Genitourinary:  Positive for dysuria and frequency. Negative for decreased urine volume, difficulty urinating, flank pain, genital sores, hematuria and urgency.  Musculoskeletal:  Negative for back pain.  Neurological:  Negative for dizziness, weakness and light-headedness.  All other systems reviewed and are negative.   Physical Exam Triage Vital Signs ED Triage Vitals  Enc Vitals Group     BP 04/29/21 1414 (!) 148/81     Pulse Rate 04/29/21 1414 90     Resp 04/29/21 1414 18     Temp 04/29/21 1414 98.1 F (36.7 C)     Temp Source 04/29/21 1414 Oral     SpO2 04/29/21 1414 98 %     Weight --      Height --      Head Circumference --      Peak Flow --      Pain Score 04/29/21 1415 0     Pain Loc --      Pain Edu? --      Excl. in GC? --     No data found.  Updated Vital Signs BP (!) 148/81 (BP Location: Right Arm)    Pulse 90    Temp 98.1 F (36.7 C) (Oral)    Resp 18    SpO2 98%   Visual Acuity Right Eye Distance:   Left Eye Distance:   Bilateral Distance:    Right Eye Near:   Left Eye Near:    Bilateral Near:     Physical Exam Vitals reviewed.  Constitutional:      General: She is not in acute distress.    Appearance: Normal appearance. She is not ill-appearing.  HENT:     Head: Normocephalic and atraumatic.     Mouth/Throat:     Mouth: Mucous membranes are moist.     Comments: Moist mucous membranes Eyes:     Extraocular Movements: Extraocular movements intact.     Pupils: Pupils are equal, round, and reactive to light.  Cardiovascular:     Rate and Rhythm: Normal rate and regular rhythm.     Heart sounds: Normal heart sounds.  Pulmonary:     Effort: Pulmonary effort is normal.     Breath sounds: Normal breath sounds. No wheezing, rhonchi or rales.  Abdominal:     General: Bowel sounds are normal. There is no distension.     Palpations: Abdomen is soft. There is no mass.     Tenderness: There is no abdominal tenderness. There is no right CVA tenderness, left CVA tenderness, guarding or rebound.     Comments: pregnant  Skin:    General: Skin is warm.     Capillary Refill: Capillary refill takes less than 2 seconds.     Comments: Good skin turgor  Neurological:     General: No focal deficit present.     Mental Status: She is alert and oriented to person, place, and time.  Psychiatric:        Mood and Affect: Mood normal.        Behavior: Behavior normal.     UC Treatments / Results  Labs (all labs ordered are listed, but only abnormal results are displayed) Labs Reviewed  POCT URINE PREGNANCY - Abnormal; Notable for the following components:      Result Value   Preg Test, Ur Positive (*)    All other components within normal limits  POCT URINALYSIS DIP (MANUAL ENTRY) - Abnormal; Notable  for the following components:   Clarity,  UA cloudy (*)    Ketones, POC UA trace (5) (*)    Spec Grav, UA >=1.030 (*)    Blood, UA trace-intact (*)    Protein Ur, POC =30 (*)    Nitrite, UA Positive (*)    Leukocytes, UA Small (1+) (*)    All other components within normal limits  URINE CULTURE    EKG   Radiology No results found.  Procedures Procedures (including critical care time)  Medications Ordered in UC Medications - No data to display  Initial Impression / Assessment and Plan / UC Course  I have reviewed the triage vital signs and the nursing notes.  Pertinent labs & imaging results that were available during my care of the patient were reviewed by me and considered in my medical decision making (see chart for details).     This patient is a very pleasant 20 y.o. year old female presenting with acute cystitis; pregnancy; nausea related to pregnancy. Afebrile, nontachycardic, no reproducible abd pain or CVAT.  U-preg positive.  UA with trace blood, positive nitrite, small leuk.  Keflex sent.  Doxylamine-pyridoxine sent at patient request, ADRs discussed.   ED return precautions discussed. Patient verbalizes understanding and agreement.    Final Clinical Impressions(s) / UC Diagnoses   Final diagnoses:  Positive pregnancy test  [redacted] weeks gestation of pregnancy  Acute cystitis with hematuria  Nausea without vomiting     Discharge Instructions      -Start the antibiotic: Keflex, 4x daily x5 days. You can take this with food if you have a sensitive stomach. -You are pregnant! Congrats!! -For your nausea: you can start the Doxylamine-pyridoxine. --2 tablets taken at bedtime (Day 1). If symptoms are controlled the following day, continue taking 2 tablets at bedtime. If symptoms persist in the afternoon of Day 2, take 2 tablets at bedtime and start taking 3 tablets on Day 3 (1 tablet in the morning and 2 tablets at bedtime). If symptoms are controlled on Day 4,  continue taking 3 tablets daily. Otherwise, take 1 tablet in the morning, 1 tablet in the mid-afternoon, and 2 tablets at bedtime (total of 4 tablets).    ED Prescriptions     Medication Sig Dispense Auth. Provider   cephALEXin (KEFLEX) 500 MG capsule Take 1 capsule (500 mg total) by mouth 4 (four) times daily. 20 capsule Hazel Sams, PA-C   Doxylamine-Pyridoxine 10-10 MG TBEC Take 2 tablets by mouth as directed. 2 tablets taken at bedtime (Day 1). If symptoms are controlled the following day, continue taking 2 tablets at bedtime. If symptoms persist in the afternoon of Day 2, take 2 tablets at bedtime and start taking 3 tablets on Day 3 (1 tablet in the morning and 2 tablets at bedtime). If symptoms are controlled on Day 4, continue taking 3 tablets daily. Otherwise, take 1 tablet in the morning, 1 tablet in the mid-afternoon, and 2 tablets at bedtime (total of 4 tablets). 60 tablet Hazel Sams, PA-C      PDMP not reviewed this encounter.   Hazel Sams, PA-C 04/29/21 [phone]

## 2021-04-29 NOTE — ED Triage Notes (Signed)
Pt states (+) pregnancy test at home and would like to validate that result here today.

## 2021-05-02 LAB — URINE CULTURE: Culture: >=100000 — AB

## 2021-05-04 ENCOUNTER — Ambulatory Visit (INDEPENDENT_AMBULATORY_CARE_PROVIDER_SITE_OTHER): Payer: Medicaid Other

## 2021-05-04 ENCOUNTER — Other Ambulatory Visit: Payer: Self-pay

## 2021-05-04 ENCOUNTER — Ambulatory Visit: Payer: Medicaid Other

## 2021-05-04 VITALS — BP 126/80 | HR 74 | Ht 67.0 in | Wt 190.0 lb

## 2021-05-04 DIAGNOSIS — Z3201 Encounter for pregnancy test, result positive: Secondary | ICD-10-CM

## 2021-05-04 DIAGNOSIS — N912 Amenorrhea, unspecified: Secondary | ICD-10-CM | POA: Diagnosis not present

## 2021-05-04 LAB — POCT URINE PREGNANCY: Preg Test, Ur: POSITIVE — AB

## 2021-05-04 NOTE — Progress Notes (Signed)
Agree with nurses's documentation of this patient's clinic encounter.  Spirit Wernli L, MD  

## 2021-05-04 NOTE — Progress Notes (Signed)
Ms. Ytuarte presents today for UPT. She has no unusual complaints. LMP:04/04/2021    OBJECTIVE: Appears well, in no apparent distress.  OB History     Gravida  2   Para  1   Term  1   Preterm      AB      Living  1      SAB      IAB      Ectopic      Multiple      Live Births  1          Home UPT Result: POSITIVE In-Office UPT result: POSITIVE  I have reviewed the patient's medical, obstetrical, social, and family histories, and medications.   ASSESSMENT: Positive pregnancy test LMP  04/04/2021 EDD   01/09/2022 [place]      [redacted]w[redacted]d  PLAN Prenatal care to be completed at: [facility]

## 2021-05-21 ENCOUNTER — Encounter (HOSPITAL_COMMUNITY): Payer: Self-pay | Admitting: Family Medicine

## 2021-05-21 ENCOUNTER — Other Ambulatory Visit: Payer: Self-pay

## 2021-05-21 ENCOUNTER — Inpatient Hospital Stay (HOSPITAL_COMMUNITY)
Admission: AD | Admit: 2021-05-21 | Discharge: 2021-05-21 | Disposition: A | Discharge status: Home / Self Care | Payer: Medicaid Other | Attending: Family Medicine | Admitting: Family Medicine

## 2021-05-21 DIAGNOSIS — B9789 Other viral agents as the cause of diseases classified elsewhere: Secondary | ICD-10-CM | POA: Diagnosis not present

## 2021-05-21 DIAGNOSIS — O99511 Diseases of the respiratory system complicating pregnancy, first trimester: Secondary | ICD-10-CM | POA: Insufficient documentation

## 2021-05-21 DIAGNOSIS — J069 Acute upper respiratory infection, unspecified: Secondary | ICD-10-CM | POA: Insufficient documentation

## 2021-05-21 DIAGNOSIS — R109 Unspecified abdominal pain: Secondary | ICD-10-CM | POA: Diagnosis not present

## 2021-05-21 DIAGNOSIS — Z87440 Personal history of urinary (tract) infections: Secondary | ICD-10-CM | POA: Insufficient documentation

## 2021-05-21 DIAGNOSIS — Z20822 Contact with and (suspected) exposure to covid-19: Secondary | ICD-10-CM | POA: Diagnosis not present

## 2021-05-21 DIAGNOSIS — O26891 Other specified pregnancy related conditions, first trimester: Secondary | ICD-10-CM | POA: Diagnosis not present

## 2021-05-21 DIAGNOSIS — Z3A01 Less than 8 weeks gestation of pregnancy: Secondary | ICD-10-CM | POA: Insufficient documentation

## 2021-05-21 DIAGNOSIS — O219 Vomiting of pregnancy, unspecified: Secondary | ICD-10-CM | POA: Insufficient documentation

## 2021-05-21 LAB — URINALYSIS, ROUTINE W REFLEX MICROSCOPIC
Bacteria, UA: NONE SEEN
Bilirubin Urine: NEGATIVE
Glucose, UA: NEGATIVE mg/dL
Hgb urine dipstick: NEGATIVE
Ketones, ur: 5 mg/dL — AB
Nitrite: NEGATIVE
Protein, ur: 30 mg/dL — AB
Specific Gravity, Urine: 1.023 (ref 1.005–1.030)
pH: 5 (ref 5.0–8.0)

## 2021-05-21 LAB — COMPREHENSIVE METABOLIC PANEL
ALT: 19 U/L (ref 0–44)
AST: 19 U/L (ref 15–41)
Albumin: 3.6 g/dL (ref 3.5–5.0)
Alkaline Phosphatase: 51 U/L (ref 38–126)
Anion gap: 9 (ref 5–15)
BUN: 5 mg/dL — ABNORMAL LOW (ref 6–20)
CO2: 23 mmol/L (ref 22–32)
Calcium: 9 mg/dL (ref 8.9–10.3)
Chloride: 99 mmol/L (ref 98–111)
Creatinine, Ser: 0.81 mg/dL (ref 0.44–1.00)
GFR, Estimated: >60 mL/min (ref >60)
Glucose, Bld: 105 mg/dL — ABNORMAL HIGH (ref 70–99)
Potassium: 3.2 mmol/L — ABNORMAL LOW (ref 3.5–5.1)
Sodium: 131 mmol/L — ABNORMAL LOW (ref 135–145)
Total Bilirubin: 0.8 mg/dL (ref 0.3–1.2)
Total Protein: 7.3 g/dL (ref 6.5–8.1)

## 2021-05-21 LAB — CBC WITH DIFFERENTIAL/PLATELET
Abs Immature Granulocytes: 0.04 10*3/uL (ref 0.00–0.07)
Basophils Absolute: 0 10*3/uL (ref 0.0–0.1)
Basophils Relative: 0 %
Eosinophils Absolute: 0 10*3/uL (ref 0.0–0.5)
Eosinophils Relative: 0 %
HCT: 38.7 % (ref 36.0–46.0)
Hemoglobin: 13.8 g/dL (ref 12.0–15.0)
Immature Granulocytes: 0 %
Lymphocytes Relative: 11 %
Lymphs Abs: 1.2 10*3/uL (ref 0.7–4.0)
MCH: 31 pg (ref 26.0–34.0)
MCHC: 35.7 g/dL (ref 30.0–36.0)
MCV: 87 fL (ref 80.0–100.0)
Monocytes Absolute: 1.3 10*3/uL — ABNORMAL HIGH (ref 0.1–1.0)
Monocytes Relative: 12 %
Neutro Abs: 8.4 10*3/uL — ABNORMAL HIGH (ref 1.7–7.7)
Neutrophils Relative %: 77 %
Platelets: 183 10*3/uL (ref 150–400)
RBC: 4.45 MIL/uL (ref 3.87–5.11)
RDW: 11.3 % — ABNORMAL LOW (ref 11.5–15.5)
WBC: 10.9 10*3/uL — ABNORMAL HIGH (ref 4.0–10.5)
nRBC: 0 % (ref 0.0–0.2)

## 2021-05-21 LAB — RESP PANEL BY RT-PCR (FLU A&B, COVID) ARPGX2
Influenza A by PCR: NEGATIVE
Influenza B by PCR: NEGATIVE
SARS Coronavirus 2 by RT PCR: NEGATIVE

## 2021-05-21 MED ORDER — ACETAMINOPHEN 500 MG PO TABS
1000.0000 mg | ORAL_TABLET | Freq: Once | ORAL | Status: AC
  Administered 2021-05-21: 1000 mg via ORAL
  Filled 2021-05-21: qty 2

## 2021-05-21 NOTE — MAU Note (Signed)
...  Nichole Finley is a 21 y.o. at [redacted]w[redacted]d here in MAU reporting: Has not been feeling well since Wednesday and has had an occasional cough. She states when she coughs it triggers her to vomit. She has been experiencing nausea and vomiting for two weeks now and takes Diclegis. She states she takes this as needed and last had it around 1400 today. She states she was dx with a UTI on 04/29/21 and was experiencing right flank pain and is still having this pain. Denies VB or abnormal discharge.   Was prescribed Keflex to take q6h over five days but has not finished the dose.  Pain score: 7/10 right flank  Lab orders placed from triage:  UA

## 2021-05-21 NOTE — MAU Provider Note (Signed)
History     CSN: [id number]  Arrival date and time: 05/21/21 1809   Event Date/Time   First Provider Initiated Contact with Patient 05/21/21 1856      Chief Complaint  Patient presents with   Nausea   Emesis   HPI Nichole Finley is a 21 y.o. G2P1001 at 5829w5d who presents with fever and flank pain. She states she was having an increase in pain today and feeling generally unwell so she took her temperature and it was 102. She states the pain is on the right flank and she rates it a 7/10. She has not tried anything to help the pain. She states she also has a cough but no sore throat or headache. She reports nausea and vomiting that she has tried diclegis for with no relief. She denies any abdominal pain, vaginal bleeding or discharge.   She reports she was diagnosed with a UTI on 12/30 but did not complete the antibiotic prescribed. She has not had COVID or Flu recently.   OB History     Gravida  2   Para  1   Term  1   Preterm      AB      Living  1      SAB      IAB      Ectopic      Multiple      Live Births  1           Past Medical History:  Diagnosis Date   Depression    Vaginal Pap smear, abnormal     Past Surgical History:  Procedure Laterality Date   NO PAST SURGERIES      Family History  Problem Relation Age of Onset   Heart disease Maternal Uncle    Heart disease Maternal Grandfather     Social History   Tobacco Use   Smoking status: Former    Types: Cigarettes    Quit date: 05/23/2017    Years since quitting: 3.9   Smokeless tobacco: Never   Tobacco comments:    before pregnancy  Vaping Use   Vaping Use: Never used  Substance Use Topics   Alcohol use: No   Drug use: No    Allergies: No Known Allergies  Medications Prior to Admission  Medication Sig Dispense Refill Last Dose   cephALEXin (KEFLEX) 500 MG capsule Take 1 capsule (500 mg total) by mouth 4 (four) times daily. 20 capsule 0 05/20/2021   Doxylamine-Pyridoxine 10-10  MG TBEC Take 2 tablets by mouth as directed. 2 tablets taken at bedtime (Day 1). If symptoms are controlled the following day, continue taking 2 tablets at bedtime. If symptoms persist in the afternoon of Day 2, take 2 tablets at bedtime and start taking 3 tablets on Day 3 (1 tablet in the morning and 2 tablets at bedtime). If symptoms are controlled on Day 4, continue taking 3 tablets daily. Otherwise, take 1 tablet in the morning, 1 tablet in the mid-afternoon, and 2 tablets at bedtime (total of 4 tablets). 60 tablet 0 05/21/2021    Review of Systems  Constitutional:  Positive for fatigue and fever.  HENT: Negative.    Respiratory: Negative.  Negative for shortness of breath.   Cardiovascular: Negative.  Negative for chest pain.  Gastrointestinal: Negative.  Negative for abdominal pain, constipation, diarrhea, nausea and vomiting.  Genitourinary:  Positive for flank pain. Negative for dysuria, vaginal bleeding and vaginal discharge.  Neurological: Negative.  Negative for dizziness  and headaches.  Physical Exam   Blood pressure 124/67, pulse 94, temperature 99.9 F (37.7 C), temperature source Oral, resp. rate 17, height 5\' 7"  (1.702 m), weight 88 kg, last menstrual period 04/04/2021, SpO2 99 %.  Patient Vitals for the past 24 hrs:  BP Temp Temp src Pulse Resp SpO2 Height Weight  05/21/21 2000 124/67 99.9 F (37.7 C) Oral 94 17 99 % -- --  05/21/21 1955 -- -- -- -- -- 99 % -- --  05/21/21 1950 -- -- -- -- -- 99 % -- --  05/21/21 1945 -- -- -- -- -- 99 % -- --  05/21/21 1940 -- -- -- -- -- 99 % -- --  05/21/21 1840 122/70 -- -- (!) 110 -- -- -- --  05/21/21 1839 -- -- -- -- -- -- 5\' 7"  (1.702 m) 88 kg  05/21/21 1835 127/68 (!) 101.5 F (38.6 C) Oral (!) 108 -- 98 % -- --   Physical Exam Vitals and nursing note reviewed.  Constitutional:      General: She is not in acute distress.    Appearance: She is well-developed.  HENT:     Head: Normocephalic.  Eyes:     Pupils: Pupils are  equal, round, and reactive to light.  Cardiovascular:     Rate and Rhythm: Normal rate and regular rhythm.     Heart sounds: Normal heart sounds.  Pulmonary:     Effort: Pulmonary effort is normal. No respiratory distress.     Breath sounds: Normal breath sounds.  Abdominal:     General: Bowel sounds are normal. There is no distension.     Palpations: Abdomen is soft.     Tenderness: There is no abdominal tenderness. There is right CVA tenderness.  Skin:    General: Skin is warm and dry.  Neurological:     Mental Status: She is alert and oriented to person, place, and time.  Psychiatric:        Mood and Affect: Mood normal.        Behavior: Behavior normal.        Thought Content: Thought content normal.        Judgment: Judgment normal.    MAU Course  Procedures Results for orders placed or performed during the hospital encounter of 05/21/21 (from the past 24 hour(s))  Urinalysis, Routine w reflex microscopic Urine, Clean Catch     Status: Abnormal   Collection Time: 05/21/21  6:56 PM  Result Value Ref Range   Color, Urine AMBER (A) YELLOW   APPearance CLOUDY (A) CLEAR   Specific Gravity, Urine 1.023 1.005 - 1.030   pH 5.0 5.0 - 8.0   Glucose, UA NEGATIVE NEGATIVE mg/dL   Hgb urine dipstick NEGATIVE NEGATIVE   Bilirubin Urine NEGATIVE NEGATIVE   Ketones, ur 5 (A) NEGATIVE mg/dL   Protein, ur 30 (A) NEGATIVE mg/dL   Nitrite NEGATIVE NEGATIVE   Leukocytes,Ua SMALL (A) NEGATIVE   RBC / HPF 0-5 0 - 5 RBC/hpf   WBC, UA 11-20 0 - 5 WBC/hpf   Bacteria, UA NONE SEEN NONE SEEN   Squamous Epithelial / LPF 11-20 0 - 5   Mucus PRESENT   CBC with Differential/Platelet     Status: Abnormal   Collection Time: 05/21/21  7:28 PM  Result Value Ref Range   WBC 10.9 (H) 4.0 - 10.5 K/uL   RBC 4.45 3.87 - 5.11 MIL/uL   Hemoglobin 13.8 12.0 - 15.0 g/dL   HCT 38.7 36.0 -  46.0 %   MCV 87.0 80.0 - 100.0 fL   MCH 31.0 26.0 - 34.0 pg   MCHC 35.7 30.0 - 36.0 g/dL   RDW 11.3 (L) 11.5 - 15.5  %   Platelets 183 150 - 400 K/uL   nRBC 0.0 0.0 - 0.2 %   Neutrophils Relative % 77 %   Neutro Abs 8.4 (H) 1.7 - 7.7 K/uL   Lymphocytes Relative 11 %   Lymphs Abs 1.2 0.7 - 4.0 K/uL   Monocytes Relative 12 %   Monocytes Absolute 1.3 (H) 0.1 - 1.0 K/uL   Eosinophils Relative 0 %   Eosinophils Absolute 0.0 0.0 - 0.5 K/uL   Basophils Relative 0 %   Basophils Absolute 0.0 0.0 - 0.1 K/uL   Immature Granulocytes 0 %   Abs Immature Granulocytes 0.04 0.00 - 0.07 K/uL     MDM UA CBC with Diff, CMP COVID swab Tylenol PO  VS improved and pain resolved. Patient able to eat crackers  Assessment and Plan   1. Viral URI with cough   2. [redacted] weeks gestation of pregnancy    -Discharge home in stable condition -Will call patient with results of COVID test -Patient advised to follow-up with OB as scheduled for prenatal care -Patient may return to MAU as needed or if her condition were to change or worsen   Wende Mott CNM 05/21/2021, 8:29 PM

## 2021-05-21 NOTE — Discharge Instructions (Signed)

## 2021-05-26 ENCOUNTER — Encounter: Payer: Medicaid Other | Admitting: Obstetrics & Gynecology

## 2021-06-03 ENCOUNTER — Ambulatory Visit (INDEPENDENT_AMBULATORY_CARE_PROVIDER_SITE_OTHER): Payer: Medicaid Other

## 2021-06-03 ENCOUNTER — Other Ambulatory Visit: Payer: Self-pay

## 2021-06-03 VITALS — BP 125/76 | HR 112 | Ht 68.0 in | Wt 188.2 lb

## 2021-06-03 DIAGNOSIS — Z3A09 9 weeks gestation of pregnancy: Secondary | ICD-10-CM

## 2021-06-03 DIAGNOSIS — Z3481 Encounter for supervision of other normal pregnancy, first trimester: Secondary | ICD-10-CM | POA: Diagnosis not present

## 2021-06-03 DIAGNOSIS — Z3490 Encounter for supervision of normal pregnancy, unspecified, unspecified trimester: Secondary | ICD-10-CM | POA: Insufficient documentation

## 2021-06-03 DIAGNOSIS — O3680X0 Pregnancy with inconclusive fetal viability, not applicable or unspecified: Secondary | ICD-10-CM

## 2021-06-03 MED ORDER — BLOOD PRESSURE KIT DEVI: 1.0000 | 0 refills | Status: DC

## 2021-06-03 MED ORDER — DICLEGIS 10-10 MG PO TBEC: 2.0000 | DELAYED_RELEASE_TABLET | Freq: Every day | ORAL | 5 refills | Status: DC

## 2021-06-03 MED ORDER — VITAFOL ULTRA 29-0.6-0.4-200 MG PO CAPS: 1.0000 | ORAL_CAPSULE | Freq: Every day | ORAL | 11 refills | Status: DC

## 2021-06-03 NOTE — Progress Notes (Signed)
Agree with nurses's documentation of this patient's clinic encounter.  Antario Yasuda L, MD  

## 2021-06-03 NOTE — Progress Notes (Signed)
New OB Intake  I connected with  Nichole Finley on 06/03/21 at 10:15 AM EST by in person and verified that I am speaking with the correct person using two identifiers. Nurse is located at [facility] and pt is located at [place].  I discussed the limitations, risks, security and privacy concerns of performing an evaluation and management service by telephone and the availability of in person appointments. I also discussed with the patient that there may be a patient responsible charge related to this service. The patient expressed understanding and agreed to proceed.  I explained I am completing New OB Intake today. We discussed her EDD of 01/02/22 that is based on early [place]. Pt is G2/P1001. I reviewed her allergies, medications, Medical/Surgical/OB history, and appropriate screenings. I informed her of [facility] services. Based on history, this is a/an  pregnancy uncomplicated .   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   MyChart/Babyscripts MyChart access verified. I explained pt will have some visits in office and some virtually. Babyscripts instructions given and order placed. Patient verifies receipt of registration text/e-mail. Account successfully created and app downloaded.  Blood Pressure Cuff  Blood pressure cuff ordered for patient to pick-up from [employer]. Explained after first prenatal appt pt will check weekly and document in Babyscripts.  Weight scale: Patient does have weight scale.   Anatomy US Explained first scheduled [place] will be around 19 weeks. Dating  and viability scan performed today. Scheduled AFP lab only appointment if [facility] pt for same day as anatomy US.   Labs Discussed [name] genetic screening with patient. Would like both Panorama and Horizon drawn at new OB visit.Also if interested in genetic testing, tell patient she will need AFP 15-21 weeks to complete genetic testing .Routine prenatal labs needed.  Covid Vaccine Patient has not covid vaccine.    Informed patient of [facility] website  and placed link in her AVS.   Social Determinants of Health Food Insecurity: Patient denies food insecurity. WIC Referral: Patient is interested in referral to [facility].  Transportation: Patient denies transportation needs. Childcare: Discussed no children allowed at ultrasound appointments. Offered childcare services; patient declines childcare services at this time.  Send link to Pregnancy Navigators   Placed OB Box on problem list and updated  First visit review I reviewed new OB appt with pt. I explained she will have a pelvic exam, ob bloodwork with genetic screening, and PAP smear. Explained pt will be seen by Peggy Constant at first visit; encounter routed to appropriate provider. Explained that patient will be seen by pregnancy navigator following visit with provider. [facility] information placed in AVS.   Hamilton Capri, RN 06/03/2021  10:38 AM

## 2021-06-13 ENCOUNTER — Other Ambulatory Visit (HOSPITAL_COMMUNITY)
Admission: RE | Admit: 2021-06-13 | Discharge: 2021-06-13 | Disposition: A | Discharge status: Home / Self Care | Payer: Medicaid Other | Source: Ambulatory Visit | Attending: Obstetrics and Gynecology | Admitting: Obstetrics and Gynecology

## 2021-06-13 ENCOUNTER — Ambulatory Visit (INDEPENDENT_AMBULATORY_CARE_PROVIDER_SITE_OTHER): Payer: Medicaid Other | Admitting: Obstetrics and Gynecology

## 2021-06-13 ENCOUNTER — Encounter: Payer: Self-pay | Admitting: Obstetrics and Gynecology

## 2021-06-13 ENCOUNTER — Other Ambulatory Visit: Payer: Self-pay

## 2021-06-13 DIAGNOSIS — Z23 Encounter for immunization: Secondary | ICD-10-CM

## 2021-06-13 DIAGNOSIS — Z3481 Encounter for supervision of other normal pregnancy, first trimester: Secondary | ICD-10-CM

## 2021-06-13 DIAGNOSIS — Z3A11 11 weeks gestation of pregnancy: Secondary | ICD-10-CM

## 2021-06-13 DIAGNOSIS — Z3480 Encounter for supervision of other normal pregnancy, unspecified trimester: Secondary | ICD-10-CM | POA: Insufficient documentation

## 2021-06-13 DIAGNOSIS — O99211 Obesity complicating pregnancy, first trimester: Secondary | ICD-10-CM

## 2021-06-13 DIAGNOSIS — O99210 Obesity complicating pregnancy, unspecified trimester: Secondary | ICD-10-CM | POA: Insufficient documentation

## 2021-06-13 MED ORDER — ASPIRIN EC 81 MG PO TBEC: 81.0000 mg | DELAYED_RELEASE_TABLET | Freq: Every day | ORAL | 2 refills | Status: DC

## 2021-06-13 MED ORDER — VITAFOL ULTRA 29-0.6-0.4-200 MG PO CAPS: 1.0000 | ORAL_CAPSULE | Freq: Every day | ORAL | 12 refills | Status: DC

## 2021-06-13 MED ORDER — PROMETHAZINE HCL 25 MG PO TABS: 25.0000 mg | ORAL_TABLET | Freq: Four times a day (QID) | ORAL | 2 refills | Status: DC | PRN

## 2021-06-13 NOTE — Progress Notes (Signed)
Pt presents for NOB visit. NOB intake with u/s completed 06/03/21 Flu vaccine L Deltiod without difficulty

## 2021-06-13 NOTE — Patient Instructions (Signed)

## 2021-06-13 NOTE — Progress Notes (Signed)
°  Subjective:    Nichole Finley is a G2P1001 [redacted]w[redacted]d being seen today for her first obstetrical visit.  Her obstetrical history is significant for uncomplicated first pregnancy and maternal obesity. Patient does intend to breast feed. Pregnancy history fully reviewed.  Patient reports no complaints.  Vitals:   06/13/21 0935  BP: 125/70  Pulse: 80  Weight: 184 lb 11.2 oz (83.8 kg)    HISTORY: OB History  Gravida Para Term Preterm AB Living  2 1 1     1   SAB IAB Ectopic Multiple Live Births          1    # Outcome Date GA Lbr Len/2nd Weight Sex Delivery Anes PTL Lv  2 Current           1 Term 10/22/17 [redacted]w[redacted]d   M Vag-Spont   LIV   Past Medical History:  Diagnosis Date   Depression    Vaginal Pap smear, abnormal    Past Surgical History:  Procedure Laterality Date   NO PAST SURGERIES     Family History  Problem Relation Age of Onset   Heart disease Maternal Grandfather    Heart disease Maternal Uncle      Exam    Uterus:   12-week size  Pelvic Exam:    Perineum: No Hemorrhoids, Normal Perineum   Vulva: normal   Vagina:  normal mucosa, normal discharge   pH:    Cervix: multiparous appearance and cervix is closed and long   Adnexa: no mass, fullness, tenderness   Bony Pelvis: gynecoid  System: Breast:  normal appearance, no masses or tenderness   Skin: normal coloration and turgor, no rashes    Neurologic: oriented, no focal deficits   Extremities: normal strength, tone, and muscle mass   HEENT extra ocular movement intact   Mouth/Teeth mucous membranes moist, pharynx normal without lesions and dental hygiene good   Neck supple and no masses   Cardiovascular: regular rate and rhythm   Respiratory:  appears well, vitals normal, no respiratory distress, acyanotic, normal RR, chest clear, no wheezing, crepitations, rhonchi, normal symmetric air entry   Abdomen: soft, non-tender; bowel sounds normal; no masses,  no organomegaly   Urinary:       Assessment:     Pregnancy: G2P1001 Patient Active Problem List   Diagnosis Date Noted   Supervision of normal pregnancy 06/03/2021   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 Prenatal vitamins. Problem list reviewed and updated. Genetic Screening discussed : Panorama ordered.  Ultrasound discussed; fetal survey: ordered. Rx ASA provided to start at 12 weeks  Follow up in 4 weeks. 50% of 30 min visit spent on counseling and coordination of care.     Amberli Ruegg 06/13/2021

## 2021-06-13 NOTE — Addendum Note (Signed)
Addended by: Dalphine Handing on: 06/13/2021 10:07 AM   Modules accepted: Orders

## 2021-06-14 LAB — CBC/D/PLT+RPR+RH+ABO+RUBIGG...
Antibody Screen: NEGATIVE
Basophils Absolute: 0 10*3/uL (ref 0.0–0.2)
Basos: 1 %
EOS (ABSOLUTE): 0.1 10*3/uL (ref 0.0–0.4)
Eos: 1 %
HCV Ab: NONREACTIVE
HIV Screen 4th Generation wRfx: NONREACTIVE
Hematocrit: 41.9 % (ref 34.0–46.6)
Hemoglobin: 14.5 g/dL (ref 11.1–15.9)
Hepatitis B Surface Ag: NEGATIVE
Immature Grans (Abs): 0 10*3/uL (ref 0.0–0.1)
Immature Granulocytes: 0 %
Lymphocytes Absolute: 1.5 10*3/uL (ref 0.7–3.1)
Lymphs: 26 %
MCH: 30.5 pg (ref 26.6–33.0)
MCHC: 34.6 g/dL (ref 31.5–35.7)
MCV: 88 fL (ref 79–97)
Monocytes Absolute: 0.4 10*3/uL (ref 0.1–0.9)
Monocytes: 7 %
Neutrophils Absolute: 3.8 10*3/uL (ref 1.4–7.0)
Neutrophils: 65 %
Platelets: 202 10*3/uL (ref 150–450)
RBC: 4.75 x10E6/uL (ref 3.77–5.28)
RDW: 12.2 % (ref 11.7–15.4)
RPR Ser Ql: NONREACTIVE
Rh Factor: POSITIVE
Rubella Antibodies, IGG: 3.82 index (ref >0.99)
WBC: 5.9 10*3/uL (ref 3.4–10.8)

## 2021-06-14 LAB — HEMOGLOBIN A1C
Est. average glucose Bld gHb Est-mCnc: 88 mg/dL
Hgb A1c MFr Bld: 4.7 % — ABNORMAL LOW (ref 4.8–5.6)

## 2021-06-14 LAB — CERVICOVAGINAL ANCILLARY ONLY
Chlamydia: NEGATIVE
Comment: NEGATIVE
Comment: NEGATIVE
Comment: NORMAL
Neisseria Gonorrhea: NEGATIVE
Trichomonas: NEGATIVE

## 2021-06-14 LAB — HCV INTERPRETATION

## 2021-06-16 ENCOUNTER — Emergency Department (HOSPITAL_COMMUNITY)
Admission: EM | Admit: 2021-06-16 | Discharge: 2021-06-16 | Disposition: A | Discharge status: Home / Self Care | Payer: Medicaid Other | Attending: Emergency Medicine | Admitting: Emergency Medicine

## 2021-06-16 ENCOUNTER — Other Ambulatory Visit: Payer: Self-pay

## 2021-06-16 DIAGNOSIS — Z3A11 11 weeks gestation of pregnancy: Secondary | ICD-10-CM | POA: Diagnosis not present

## 2021-06-16 DIAGNOSIS — R569 Unspecified convulsions: Secondary | ICD-10-CM | POA: Insufficient documentation

## 2021-06-16 DIAGNOSIS — N9489 Other specified conditions associated with female genital organs and menstrual cycle: Secondary | ICD-10-CM | POA: Diagnosis not present

## 2021-06-16 DIAGNOSIS — O26891 Other specified pregnancy related conditions, first trimester: Secondary | ICD-10-CM | POA: Insufficient documentation

## 2021-06-16 DIAGNOSIS — O2341 Unspecified infection of urinary tract in pregnancy, first trimester: Secondary | ICD-10-CM | POA: Insufficient documentation

## 2021-06-16 DIAGNOSIS — N390 Urinary tract infection, site not specified: Secondary | ICD-10-CM

## 2021-06-16 LAB — CBC WITH DIFFERENTIAL/PLATELET
Abs Immature Granulocytes: 0.01 10*3/uL (ref 0.00–0.07)
Basophils Absolute: 0 10*3/uL (ref 0.0–0.1)
Basophils Relative: 1 %
Eosinophils Absolute: 0 10*3/uL (ref 0.0–0.5)
Eosinophils Relative: 1 %
HCT: 42.7 % (ref 36.0–46.0)
Hemoglobin: 14.3 g/dL (ref 12.0–15.0)
Immature Granulocytes: 0 %
Lymphocytes Relative: 22 %
Lymphs Abs: 1.3 10*3/uL (ref 0.7–4.0)
MCH: 30.3 pg (ref 26.0–34.0)
MCHC: 33.5 g/dL (ref 30.0–36.0)
MCV: 90.5 fL (ref 80.0–100.0)
Monocytes Absolute: 0.4 10*3/uL (ref 0.1–1.0)
Monocytes Relative: 7 %
Neutro Abs: 4 10*3/uL (ref 1.7–7.7)
Neutrophils Relative %: 69 %
Platelets: 198 10*3/uL (ref 150–400)
RBC: 4.72 MIL/uL (ref 3.87–5.11)
RDW: 12.5 % (ref 11.5–15.5)
WBC: 5.8 10*3/uL (ref 4.0–10.5)
nRBC: 0 % (ref 0.0–0.2)

## 2021-06-16 LAB — CYTOLOGY - PAP
Comment: NEGATIVE
Diagnosis: UNDETERMINED — AB
High risk HPV: POSITIVE — AB

## 2021-06-16 LAB — CULTURE, OB URINE

## 2021-06-16 LAB — COMPREHENSIVE METABOLIC PANEL
ALT: 14 U/L (ref 0–44)
AST: 17 U/L (ref 15–41)
Albumin: 3.8 g/dL (ref 3.5–5.0)
Alkaline Phosphatase: 43 U/L (ref 38–126)
Anion gap: 11 (ref 5–15)
BUN: 9 mg/dL (ref 6–20)
CO2: 19 mmol/L — ABNORMAL LOW (ref 22–32)
Calcium: 9.5 mg/dL (ref 8.9–10.3)
Chloride: 106 mmol/L (ref 98–111)
Creatinine, Ser: 0.82 mg/dL (ref 0.44–1.00)
GFR, Estimated: >60 mL/min (ref >60)
Glucose, Bld: 87 mg/dL (ref 70–99)
Potassium: 4 mmol/L (ref 3.5–5.1)
Sodium: 136 mmol/L (ref 135–145)
Total Bilirubin: 0.4 mg/dL (ref 0.3–1.2)
Total Protein: 7.2 g/dL (ref 6.5–8.1)

## 2021-06-16 LAB — RAPID URINE DRUG SCREEN, HOSP PERFORMED
Amphetamines: NOT DETECTED
Barbiturates: NOT DETECTED
Benzodiazepines: NOT DETECTED
Cocaine: NOT DETECTED
Opiates: NOT DETECTED
Tetrahydrocannabinol: NOT DETECTED

## 2021-06-16 LAB — URINALYSIS, ROUTINE W REFLEX MICROSCOPIC
Bilirubin Urine: NEGATIVE
Glucose, UA: NEGATIVE mg/dL
Hgb urine dipstick: NEGATIVE
Ketones, ur: 80 mg/dL — AB
Leukocytes,Ua: NEGATIVE
Nitrite: POSITIVE — AB
Protein, ur: 30 mg/dL — AB
Specific Gravity, Urine: 1.027 (ref 1.005–1.030)
pH: 6 (ref 5.0–8.0)

## 2021-06-16 LAB — URINE CULTURE, OB REFLEX

## 2021-06-16 LAB — HCG, QUANTITATIVE, PREGNANCY: hCG, Beta Chain, Quant, S: 95193 m[IU]/mL — ABNORMAL HIGH (ref <5)

## 2021-06-16 MED ORDER — CEPHALEXIN 500 MG PO CAPS: 500.0000 mg | ORAL_CAPSULE | Freq: Three times a day (TID) | ORAL | 0 refills | Status: DC

## 2021-06-16 NOTE — Discharge Instructions (Addendum)
Your laboratory results are within normal limits today.  Your urinalysis showed signs of infection.  I have provided a prescription for antibiotics, please take 1 tablet 3 times a day for the next 7 days.  You may need to have a urine test by your OB/GYN after completion of antibiotics.

## 2021-06-16 NOTE — ED Provider Notes (Signed)
[facility] EMERGENCY DEPARTMENT Provider Note   CSN: [id number] Arrival date & time: 06/16/21  4098     History  Chief Complaint  Patient presents with   Seizures    Nichole Finley is a 21 y.o. female.  21 y.o female G2P1 currently [redacted] weeks pregnant with no PMH presents to the ED via EMS s/p seizure like activity. [place] the mall for about 1 hour when she began to feel hot, "her eyes rolled to the back of her head", began to have seizure-like activity according to her boyfriend which lasted 2 to 3 minutes.  She reports waking up feeling somewhat confused.  She reports no other symptoms at this time aside from feeling nauseous but attributes this to pregnancy.  She continues to have a slight headache but also feels hot.  She was evaluated at [facility] last Monday and did have a Pap smear along with an ultrasound to check for babies heart 8.  Reports no history of seizures.  She denies any vaginal bleeding, vaginal cramping, vomiting.    The history is provided by the patient and medical records.  Seizures Seizure activity on arrival: no   Seizure type:  Myoclonic     Home Medications Prior to Admission medications   Medication Sig Start Date End Date Taking? Authorizing Provider  cephALEXin (KEFLEX) 500 MG capsule Take 1 capsule (500 mg total) by mouth 3 (three) times daily for 7 days. 06/16/21 06/23/21 Yes Janeece Fitting, PA-C  aspirin EC 81 MG tablet Take 1 tablet (81 mg total) by mouth daily. Take after 12 weeks for prevention of preeclampsia later in pregnancy 06/13/21   Constant, Peggy, MD  Blood Pressure Monitoring (BLOOD PRESSURE KIT) DEVI 1 kit by Does not apply route once a week. Patient not taking: Reported on 06/13/2021 06/03/21   Chancy Milroy, MD  DICLEGIS 10-10 MG TBEC Take 2 tablets by mouth at bedtime. If symptoms persist, add one tablet in the morning and one in the afternoon 06/03/21   Chancy Milroy, MD  Doxylamine-Pyridoxine 10-10 MG TBEC Take 2  tablets by mouth as directed. 2 tablets taken at bedtime (Day 1). If symptoms are controlled the following day, continue taking 2 tablets at bedtime. If symptoms persist in the afternoon of Day 2, take 2 tablets at bedtime and start taking 3 tablets on Day 3 (1 tablet in the morning and 2 tablets at bedtime). If symptoms are controlled on Day 4, continue taking 3 tablets daily. Otherwise, take 1 tablet in the morning, 1 tablet in the mid-afternoon, and 2 tablets at bedtime (total of 4 tablets). Patient not taking: Reported on 06/13/2021 04/29/21   Hazel Sams, PA-C  Prenat-Fe Poly-Methfol-FA-DHA (VITAFOL ULTRA) 29-0.6-0.4-200 MG CAPS Take 1 capsule by mouth daily. Patient not taking: Reported on 06/13/2021 06/03/21   Chancy Milroy, MD  Prenat-Fe Poly-Methfol-FA-DHA (VITAFOL ULTRA) 29-0.6-0.4-200 MG CAPS Take 1 tablet by mouth daily. 06/13/21   Constant, Peggy, MD  promethazine (PHENERGAN) 25 MG tablet Take 1 tablet (25 mg total) by mouth every 6 (six) hours as needed for nausea or vomiting. 06/13/21   Constant, Vickii Chafe, MD      Allergies    Patient has no known allergies.    Review of Systems   Review of Systems  Constitutional:  Negative for fever.  HENT:  Negative for sore throat.   Respiratory:  Negative for shortness of breath.   Cardiovascular:  Negative for chest pain.  Gastrointestinal:  Positive for  nausea. Negative for abdominal pain and vomiting.  Genitourinary:  Negative for difficulty urinating, flank pain, vaginal bleeding and vaginal discharge.  Neurological:  Positive for seizures.  All other systems reviewed and are negative.  Physical Exam Updated Vital Signs BP 117/72    Pulse 90    Temp 98.2 F (36.8 C) (Oral)    Resp (!) 22    Ht _0  (1.727 m)    Wt 85.7 kg    LMP 04/04/2021 (Approximate)    SpO2 100%    BMI 28.74 kg/m  Physical Exam Vitals and nursing note reviewed.  Constitutional:      General: She is not in acute distress.    Appearance: She is well-developed.   HENT:     Head: Normocephalic and atraumatic.     Mouth/Throat:     Pharynx: No oropharyngeal exudate.  Eyes:     Pupils: Pupils are equal, round, and reactive to light.  Cardiovascular:     Rate and Rhythm: Regular rhythm.     Heart sounds: Normal heart sounds.  Pulmonary:     Effort: Pulmonary effort is normal. No respiratory distress.     Breath sounds: Normal breath sounds.  Abdominal:     General: Bowel sounds are normal. There is no distension.     Palpations: Abdomen is soft.     Tenderness: There is no abdominal tenderness.  Musculoskeletal:        General: No tenderness or deformity.     Cervical back: Normal range of motion.     Right lower leg: No edema.     Left lower leg: No edema.  Skin:    General: Skin is warm and dry.  Neurological:     Mental Status: She is alert and oriented to person, place, and time.    ED Results / Procedures / Treatments   Labs (all labs ordered are listed, but only abnormal results are displayed) Labs Reviewed  COMPREHENSIVE METABOLIC PANEL - Abnormal; Notable for the following components:      Result Value   CO2 19 (*)    All other components within normal limits  URINALYSIS, ROUTINE W REFLEX MICROSCOPIC - Abnormal; Notable for the following components:   APPearance HAZY (*)    Ketones, ur 80 (*)    Protein, ur 30 (*)    Nitrite POSITIVE (*)    Bacteria, UA MANY (*)    All other components within normal limits  HCG, QUANTITATIVE, PREGNANCY - Abnormal; Notable for the following components:   hCG, Beta Chain, Quant, S 95,193 (*)    All other components within normal limits  CBC WITH DIFFERENTIAL/PLATELET  RAPID URINE DRUG SCREEN, HOSP PERFORMED    EKG None  Radiology No results found.  Procedures Procedures   Medications Ordered in ED Medications - No data to display  ED Course/ Medical Decision Making/ A&P Clinical Course as of 06/16/21 2258  Thu Jun 16, 2021  2237 Nitrite(!): POSITIVE [JS]  2237 Bacteria,  UA(!): MANY [JS]  2237 WBC, UA: 6-10 [JS]  2248 HCG, Beta Chain, Quant, S(!): 95,193 [JS]    Clinical Course User Index [JS] Janeece Fitting, PA-C                           Medical Decision Making Amount and/or Complexity of Data Reviewed Labs: ordered. Decision-making details documented in ED Course.  Risk Prescription drug management.  This patient presents to the ED for concern of seizure  like activity this involves a number of treatment options, and is a complaint that carries with it a high risk of complications and morbidity.  The differential diagnosis includes syncope, seizure like activity, ectopic.    Co morbidities: Discussed in HPI   Brief History:  Patient here after seizure-like activity at the mall while ambulating with her boyfriend.  Prior history of seizures, patient is currently [redacted] weeks pregnant this is her second pregnancy.  Without any red flags such as vaginal bleeding, lower abdominal cramping.  Does endorse some nausea but this has been ongoing to her pregnancy  EMR reviewed including pt PMHx, past surgical history and past visits to ER.   See HPI for more details   Lab Tests:  I ordered and independently interpreted labs.  The pertinent results include:    I personally reviewed all laboratory work and imaging. Metabolic panel without any acute abnormality specifically kidney function within normal limits and no significant electrolyte abnormalities. CBC without leukocytosis or significant anemia.   Imaging Studies:  No imaging studies ordered for this patient   Reevaluation:  After the interventions noted above I re-evaluated patient and found that they have :improved   Social Determinants of Health:  The patient's social determinants of health were a factor in the care of this patient    Problem List / ED Course:  Seizure-like activity.  No seizures have occurred in the past 4-1/2 hours.  Patient was not started on any medication by  neurology follow-up was given.   Dispostion:  After consideration of the diagnostic results and the patients response to treatment, I feel that the would benefit from p.o. antibiotics of Keflex to treat her UTI.  She will also follow-up with urology for her seizure-like activity.  Patient is not to drive for the next 6 months or until she sees a neurologist.  Given these precautions upon discharge.  Does have a recent ultrasound a few days ago in the month of February that shows an IUP, therefore have low suspicion for ectopic.  She does not have any vaginal bleeding or cramping to suggest miscarrying at this time.  She is agreeable to plan and treatment.  Portions of this note were generated with [employer]. Dictation errors may occur despite best attempts at proofreading.   Final Clinical Impression(s) / ED Diagnoses Final diagnoses:  Seizure-like activity ([place])  Lower urinary tract infectious disease    Rx / DC Orders ED Discharge Orders          Ordered    cephALEXin (KEFLEX) 500 MG capsule  3 times daily        06/16/21 2238              Janeece Fitting, PA-C 06/16/21 [place], [place], DO 06/16/21 2332

## 2021-06-16 NOTE — ED Triage Notes (Signed)
Pt bib [facility] from the mall with her boyfriend. Pt was c/o of feeling hot and pt fell out and began seizing for 3-5 mins. By the time EMS got to pt she was A&Ox4. This is pt second pregnancy, no complications with the 1st pregnancy, she is not considered high risk for this one. Pt does have prenatal care. Pt has no hx of seizure disorder or other medical conditions.  EMS vitals 146/90 BP 81 CBG 84 HR 100% O2

## 2021-06-20 ENCOUNTER — Encounter: Payer: Medicaid Other | Admitting: Licensed Clinical Social Worker

## 2021-06-22 ENCOUNTER — Encounter: Payer: Self-pay | Admitting: Obstetrics and Gynecology

## 2021-06-29 ENCOUNTER — Encounter: Payer: Self-pay | Admitting: Obstetrics and Gynecology

## 2021-07-11 ENCOUNTER — Ambulatory Visit (INDEPENDENT_AMBULATORY_CARE_PROVIDER_SITE_OTHER): Payer: Medicaid Other | Admitting: Obstetrics and Gynecology

## 2021-07-11 ENCOUNTER — Encounter: Payer: Self-pay | Admitting: Obstetrics and Gynecology

## 2021-07-11 ENCOUNTER — Ambulatory Visit (INDEPENDENT_AMBULATORY_CARE_PROVIDER_SITE_OTHER): Payer: Self-pay | Admitting: Licensed Clinical Social Worker

## 2021-07-11 ENCOUNTER — Other Ambulatory Visit: Payer: Self-pay

## 2021-07-11 VITALS — BP 121/76 | HR 111 | Wt 187.9 lb

## 2021-07-11 DIAGNOSIS — Z3482 Encounter for supervision of other normal pregnancy, second trimester: Secondary | ICD-10-CM

## 2021-07-11 DIAGNOSIS — O99340 Other mental disorders complicating pregnancy, unspecified trimester: Secondary | ICD-10-CM

## 2021-07-11 DIAGNOSIS — O99210 Obesity complicating pregnancy, unspecified trimester: Secondary | ICD-10-CM

## 2021-07-11 DIAGNOSIS — Z148 Genetic carrier of other disease: Secondary | ICD-10-CM | POA: Insufficient documentation

## 2021-07-11 DIAGNOSIS — F32A Depression, unspecified: Secondary | ICD-10-CM

## 2021-07-11 NOTE — Progress Notes (Signed)
Pt presents for ROB visit. She does not have any concerns today. ?

## 2021-07-11 NOTE — Progress Notes (Signed)
? ?  PRENATAL VISIT NOTE ? ?Subjective:  ?Nichole Finley is a 21 y.o. G2P1001 at [redacted]w[redacted]d being seen today for ongoing prenatal care.  She is currently monitored for the following issues for this low-risk pregnancy and has MDD (major depressive disorder), single episode, moderate (HCC); Attention deficit hyperactivity disorder (ADHD), combined type, severe; Oppositional defiant disorder; Severe single current episode of major depressive disorder, without psychotic features (HCC); False positive HIV serology; Supervision of normal pregnancy; and Maternal obesity affecting pregnancy, antepartum on their problem list. ? ?Patient reports no complaints.  Contractions: Not present. Vag. Bleeding: None.  Movement: Present. Denies leaking of fluid.  ? ?The following portions of the patient's history were reviewed and updated as appropriate: allergies, current medications, past family history, past medical history, past social history, past surgical history and problem list.  ? ?Objective:  ? ?Vitals:  ? 07/11/21 1602  ?BP: 121/76  ?Pulse: (!) 111  ?Weight: 187 lb 14.4 oz (85.2 kg)  ? ? ?Fetal Status: Fetal Heart Rate (bpm): 155   Movement: Present    ? ?General:  Alert, oriented and cooperative. Patient is in no acute distress.  ?Skin: Skin is warm and dry. No rash noted.   ?Cardiovascular: Normal heart rate noted  ?Respiratory: Normal respiratory effort, no problems with respiration noted  ?Abdomen: Soft, gravid, appropriate for gestational age.  Pain/Pressure: Absent     ?Pelvic: Cervical exam deferred        ?Extremities: Normal range of motion.  Edema: None  ?Mental Status: Normal mood and affect. Normal behavior. Normal judgment and thought content.  ? ?Assessment and Plan:  ?Pregnancy: G2P1001 at [redacted]w[redacted]d ?1. 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 ?2. Maternal obesity affecting pregnancy, antepartum ?Continue ASA ? ?Preterm labor symptoms and general obstetric precautions including but not limited to vaginal bleeding, contractions, leaking of fluid and fetal movement were reviewed in detail with the patient. ?Please refer to After Visit Summary for other counseling recommendations.  ? ?Return in about 4 weeks (around 08/08/2021) for in person, ROB, Low risk. ? ?Future Appointments  ?Date Time Provider Department Center  ?07/11/2021  4:30 PM Gwyndolyn Saxon, LCSW [facility] None  ?08/08/2021  1:45 PM [facility] US5 [facility]  ? ? ?Catalina Antigua, MD ? ?

## 2021-07-12 ENCOUNTER — Other Ambulatory Visit: Payer: Self-pay | Admitting: *Deleted

## 2021-07-12 DIAGNOSIS — Z3482 Encounter for supervision of other normal pregnancy, second trimester: Secondary | ICD-10-CM

## 2021-07-12 NOTE — Progress Notes (Signed)
U/s order changed to Detail u/s per radiology request.  ?

## 2021-07-13 LAB — AFP, SERUM, OPEN SPINA BIFIDA
AFP MoM: 1.26
AFP Value: 36.7 ng/mL
Gest. Age on Collection Date: 15 weeks
Maternal Age At EDD: 22.7 yr
OSBR Risk 1 IN: 10000
Test Results:: NEGATIVE
Weight: 187 [lb_av]

## 2021-07-13 NOTE — BH Specialist Note (Signed)
Integrated Behavioral Health Initial In-Person Visit ? ?MRN: [id number] ?Name: Nichole Finley ? ?Number of Integrated Behavioral Health Clinician visits: 1 ?Session Start time:   4:30pm ?Session End time: 4:52pm ?Total time in minutes: 22 mins in person at [facility]  ? ?Types of Service: Individual psychotherapy ? ?Interpretor:No. Interpretor Name and Language: None ? ? Warm Hand Off Completed. ?  ? ?  ? ? ?Subjective: ?Nichole Finley is a 21 y.o. female accompanied by n/a ?Patient was referred by Chana Bode MD for hx of depression and adhd ?Patient reports the following symptoms/concerns: depressed mood, social isolation, fluctuating mood and sleep difficulty  ?Duration of problem: over one year; Severity of problem: mild ? ?Objective: ?Mood: good and Affect: Appropriate ?Risk of harm to self or others: No plan to harm self or others ? ?Life Context: ?Family and Social: Lives in [place]  ?School/Work: n/a ?Self-Care: n/a ?Life Changes: New Pregnancy ? ?Patient and/or Family's Strengths/Protective Factors: ?Concrete supports in place (healthy food, safe environments, etc.) ? ?Goals Addressed: ?Patient will: ?Reduce symptoms of: depression ?Increase knowledge and/or ability of: coping skills  ?Demonstrate ability to: Increase adequate support systems for patient/family ? ?Progress towards Goals: ?Ongoing ? ?Interventions: ?Interventions utilized: Supportive Counseling  ?Standardized Assessments completed: PHQ 9 ? ?Assessment: ?Patient reports a history of major depressive disorder. Nichole Finley reports depressed mood and social isolation more than several days. Nichole Finley reports she does not take medication for depression. Staying busy, taking a nap and listening to music is Nichole Finley coping skills  ?  ?Patient may benefit from integrated behavorial health. ? ?Plan: ?Follow up with behavioral health clinician on : 08/09/2021 ?Behavioral recommendations: Continue with current coping skills, engage in relaxation techniques, and  keep ibh and ob appts  ?Referral(s): Integrated [employer] (In Clinic) ?"From scale of 1-10, how likely are you to follow plan?":  ? ?Gwyndolyn Saxon, LCSW ? ? ? ? ? ? ? ? ?

## 2021-07-14 ENCOUNTER — Other Ambulatory Visit: Payer: Self-pay | Admitting: Obstetrics and Gynecology

## 2021-07-14 LAB — URINE CULTURE, OB REFLEX

## 2021-07-14 LAB — CULTURE, OB URINE

## 2021-07-14 MED ORDER — CEPHALEXIN 500 MG PO CAPS: 500.0000 mg | ORAL_CAPSULE | Freq: Three times a day (TID) | ORAL | 0 refills | Status: AC

## 2021-08-08 ENCOUNTER — Encounter: Payer: Self-pay | Admitting: *Deleted

## 2021-08-08 ENCOUNTER — Other Ambulatory Visit: Payer: Self-pay | Admitting: *Deleted

## 2021-08-08 ENCOUNTER — Ambulatory Visit: Payer: Medicaid Other | Attending: Obstetrics and Gynecology

## 2021-08-08 ENCOUNTER — Ambulatory Visit: Payer: Medicaid Other | Admitting: *Deleted

## 2021-08-08 ENCOUNTER — Ambulatory Visit (HOSPITAL_BASED_OUTPATIENT_CLINIC_OR_DEPARTMENT_OTHER): Payer: Medicaid Other

## 2021-08-08 ENCOUNTER — Ambulatory Visit: Payer: Self-pay | Admitting: Genetics

## 2021-08-08 VITALS — BP 134/56 | HR 86

## 2021-08-08 DIAGNOSIS — Z3482 Encounter for supervision of other normal pregnancy, second trimester: Secondary | ICD-10-CM

## 2021-08-08 DIAGNOSIS — O285 Abnormal chromosomal and genetic finding on antenatal screening of mother: Secondary | ICD-10-CM

## 2021-08-08 DIAGNOSIS — Z315 Encounter for genetic counseling: Secondary | ICD-10-CM | POA: Diagnosis not present

## 2021-08-08 DIAGNOSIS — Z3A19 19 weeks gestation of pregnancy: Secondary | ICD-10-CM | POA: Insufficient documentation

## 2021-08-08 DIAGNOSIS — Z148 Genetic carrier of other disease: Secondary | ICD-10-CM

## 2021-08-08 DIAGNOSIS — Z3689 Encounter for other specified antenatal screening: Secondary | ICD-10-CM | POA: Insufficient documentation

## 2021-08-08 DIAGNOSIS — O99210 Obesity complicating pregnancy, unspecified trimester: Secondary | ICD-10-CM | POA: Insufficient documentation

## 2021-08-08 DIAGNOSIS — Z362 Encounter for other antenatal screening follow-up: Secondary | ICD-10-CM

## 2021-08-08 NOTE — Progress Notes (Addendum)
?Name: Nichole Finley Indication: Increased risk to be Nichole Finley for SMA  ?DOB: 11-04-2000 Age: 21 y.o.   ?EDC: 01/02/2022 LMP: Approximately 04/04/2021 Referring Provider:  ?Nichole Bellman, MD  ?EGA: [id number] Counselor: ?Nichole Righter MS, CGC  ?OB Hx: G2P1001 Date of Appointment: 08/08/2021  ?Accompanied by: Father of the current pregnancy (Nichole Finley Face to Face Time: 30 Minutes  ? ?Previous Testing Completed: ?ALindseapreviously completed Non-Invasive Prenatal Screening (NIPS) in this pregnancy. The result is low risk, consistent with Nichole female fetus. This screening significantly reduces the risk that the current pregnancy has Down syndrome, Trisomy 159 Trisomy 16 and common sex chromosome conditions, however, the risk is not zero given the limitations of NIPS. Additionally, there are many genetic conditions that cannot be detected by NIPS.  ?ADestiniepreviously completed Finley screening. She screened to have an increased chance to be Nichole Finley for Spinal Muscular Atrophy (SMA). She screened to not be Nichole Finley for Cystic Fibrosis (CF), alpha thalassemia, and beta hemoglobinopathies. Nichole negative result on Finley screening reduces the likelihood of being Nichole Finley, however, does not entirely rule out the possibility. ?ATeganpreviously completed Nichole maternal serum AFP screen in this pregnancy. The result is screen negative. Nichole negative result reduces the risk that the current pregnancy has an open neural tube defect. Closed neural tube defects and some open defects may not be detected by this screen.   ? ?Medical History:  ?This is Nichole Finley's 2nd pregnancy. She has Nichole living, healthy son from Nichole previous partner. ?Reports she takes baby Aspirin and prenatal vitamins occasionally. 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 Reports she had Nichole seizure approximately two months ago. [facility]. ?Denies personal  history of diabetes, high blood pressure, and thyroid conditions. ?Denies bleeding, infections, and fevers in this pregnancy. ?Denies using tobacco, alcohol, or street drugs in this pregnancy.  ? ?Family History: Nichole pedigree was created and scanned into Epic under the Media tab. ?AMaharireports that one of her cousins has Down syndrome. She reports another distant family member with an apparently isolated heart defect. Her first degree relatives are reported to be healthy.  ?Maternal ethnicity reported as African American and paternal ethnicity reported as African American. ?Denies Ashkenazi Jewish ancestry. ?Family history not remarkable for consanguinity, autism spectrum disorder, multiple spontaneous abortions, still births, or unexplained neonatal death.  ?   ?Genetic Counseling:  ? ?Increased Risk to be Nichole Finley of Spinal Muscular Atrophy (SMA). SMA is an autosomal recessive disease caused by loss of function mutations in the SMN1 gene. Nichole Finley screened to have two functional copies of the SMN1 gene, however, due to limitations of genetic screening the laboratory cannot confirm if Nichole Finley two copies are on the same chromosome or on opposite chromosomes. The laboratory identified the variant [id number] in Nichole Finley's screening, meaning there is increased risk for Nichole Finley's two copies of the SMN1 gene to be on the same chromosome. Specifically, given Nichole Finley's ethnic background, her risk to be Nichole Finley is approximately 1 in 37 Genetic counseling reviewed with [name] if her two copies are on the same chromosome that means her other chromosome would have zero copies, and there could be risk for an affected pregnancy if her reproductive partner is found to be Nichole Finley for SMA. Individuals with SMA have progressive, proximal, and symmetrical muscle weakness and atrophy due to degeneration of motor neurons of the spine  and brainstem. Symptoms can first appear at any time between before birth to adulthood.  There are five types of SMA that are historically classified based on clinical presentation, onset of symptoms, and the maximum skill level of motor milestones achieved, however, there is Nichole lot of overlap between types. There are several treatments available for individuals affected with SMA and studies show that treatment is most effective when it is started in the first few months of life. Given Nichole Finley's Finley screening result, genetic counseling recommended screening her partner for SMA. Nichole Finley verbalized understanding of the information discussed and accepted Finley screening for SMA. He was provided Nichole Finley to take home and complete at his earliest convenience. We reviewed that results will be available approximately 2-3 weeks from when the laboratory receives his saliva sample. ?  ? ?Testing/Screening Options:  ? ?Finley Screening. Per the ACOG Committee Opinion 691, if an individual is found to be Nichole Finley for Nichole specific condition, the individual's reproductive partner should be offered testing in order to receive informed genetic counseling about potential reproductive outcomes. Genetic counseling offered Nichole Finley Finley screening for SMA given Nichole Finley's Finley screening result.  ?  ?  ?Patient Plan: ? ?Proceed with: SMA Finley screening for [name].  ? ?Addendum as of 09/01/21: When Nichole Finley and Nichole [facility] on 08/08/21 Nichole Finley took his saliva Finley home so that he could add his insurance information to the test requisition. Nichole Finley reported to genetic counseling that he would send in the saliva Finley to the laboratory using the prepaid [employer] envelope. The tracking number for Nichole Finley's prepaid [employer] envelope [phone]) reports the Finley has not been sent in. Genetic counseling has contacted Nichole Finley three separate times (telephone call and detailed voicemail on 08/19/21, MyChart message on 08/24/21, and another MyChart message on 08/29/21) to inquire if her partner still  intends on completing the screening. Given that genetic counseling has not heard Nichole response from [place] and an appropriate amount of time has passed we are no longer going to follow-up on this case. Should this couple wish to complete Finley screening in the future they should contact [phone] and ask to speak to Nichole genetic counselor.  ? ?Informed consent was obtained. All questions were answered. ?  ?Thank you for sharing in the care of [facility] with [place].  ?Please do not hesitate to contact us if you have any questions. ? ?Staci Righter, MS, CGC ?Certified Genetic Counselor ?

## 2021-08-09 ENCOUNTER — Encounter: Payer: Medicaid Other | Admitting: Obstetrics and Gynecology

## 2021-08-19 ENCOUNTER — Telehealth: Payer: Self-pay | Admitting: Genetics

## 2021-08-19 NOTE — Telephone Encounter (Signed)
Called Nichole Finley regarding her reproductive partner's Nichole Finley) carrier screening. When Nichole Finley and Nichole Finley [facility] on 08/08/2021 Nichole Finley took his saliva kit home so that he could add his insurance information to the test requisition. Nichole Finley reported to genetic counseling that he would send in the saliva kit to the laboratory using the prepaid [employer] envelope. As of 08/19/2021 the laboratory has not received Nichole Finley's saliva sample. Genetic counseling left Nichole Finley a voicemail inquiring if [place] sent the saliva sample in. The telephone number for the [facility] was left in the voicemail.  ?

## 2021-08-22 ENCOUNTER — Ambulatory Visit (INDEPENDENT_AMBULATORY_CARE_PROVIDER_SITE_OTHER): Payer: Medicaid Other | Admitting: Medical

## 2021-08-22 ENCOUNTER — Encounter: Payer: Self-pay | Admitting: Medical

## 2021-08-22 VITALS — BP 119/69 | HR 90 | Wt 195.9 lb

## 2021-08-22 DIAGNOSIS — Z3A21 21 weeks gestation of pregnancy: Secondary | ICD-10-CM

## 2021-08-22 DIAGNOSIS — O99891 Other specified diseases and conditions complicating pregnancy: Secondary | ICD-10-CM

## 2021-08-22 DIAGNOSIS — R8271 Bacteriuria: Secondary | ICD-10-CM

## 2021-08-22 DIAGNOSIS — Z3482 Encounter for supervision of other normal pregnancy, second trimester: Secondary | ICD-10-CM

## 2021-08-22 DIAGNOSIS — N3000 Acute cystitis without hematuria: Secondary | ICD-10-CM

## 2021-08-22 NOTE — Progress Notes (Signed)
? ?  PRENATAL VISIT NOTE ? ?Subjective:  ?Nichole Finley is a 21 y.o. G2P1001 at [redacted]w[redacted]d being seen today for ongoing prenatal care.  She is currently monitored for the following issues for this low-risk pregnancy and has MDD (major depressive disorder), single episode, moderate (HCC); Attention deficit hyperactivity disorder (ADHD), combined type, severe; Oppositional defiant disorder; Severe single current episode of major depressive disorder, without psychotic features (HCC); False positive HIV serology; Supervision of normal pregnancy; Maternal obesity affecting pregnancy, antepartum; and Carrier of spinal muscular atrophy on their problem list. ? ?Patient reports occasional lightheaded feeling without LOC.  Contractions: Not present. Vag. Bleeding: None.  Movement: Present. Denies leaking of fluid.  ? ?The following portions of the patient's history were reviewed and updated as appropriate: allergies, current medications, past family history, past medical history, past social history, past surgical history and problem list.  ? ?Objective:  ? ?Vitals:  ? 08/22/21 1510  ?BP: 119/69  ?Pulse: 90  ?Weight: 195 lb 14.4 oz (88.9 kg)  ? ? ?Fetal Status: Fetal Heart Rate (bpm): 150 Fundal Height: 20 cm Movement: Present    ? ?General:  Alert, oriented and cooperative. Patient is in no acute distress.  ?Skin: Skin is warm and dry. No rash noted.   ?Cardiovascular: Normal heart rate noted  ?Respiratory: Normal respiratory effort, no problems with respiration noted  ?Abdomen: Soft, gravid, appropriate for gestational age.  Pain/Pressure: Absent     ?Pelvic: Cervical exam deferred        ?Extremities: Normal range of motion.  Edema: Trace  ?Mental Status: Normal mood and affect. Normal behavior. Normal judgment and thought content.  ? ?Assessment and Plan:  ?Pregnancy: G2P1001 at [redacted]w[redacted]d ?1. Acute cystitis without hematuria ?- Culture, OB Urine - TOC ? ?2. Encounter for supervision of other normal pregnancy in second trimester ?-  Planning PP IUD ? ?3. [redacted] weeks gestation ? ?Preterm labor symptoms and general obstetric precautions including but not limited to vaginal bleeding, contractions, leaking of fluid and fetal movement were reviewed in detail with the patient. ?Please refer to After Visit Summary for other counseling recommendations.  ? ?Return in about 4 weeks (around 09/19/2021) for LOB, any provider, In-Person. ? ?Future Appointments  ?Date Time Provider Department Center  ?09/05/2021  2:45 PM [facility] NURSE [facility]  ?09/05/2021  3:00 PM [facility] US1 [facility]  ?09/19/2021  1:30 PM Leftwich-Kirby, Wilmer Floor, CNM [facility] None  ? ? ?[name], PA-C ? ?

## 2021-08-22 NOTE — Progress Notes (Signed)
Patient presents for ROB. Patient has no concerns today. 

## 2021-08-24 ENCOUNTER — Encounter: Payer: Self-pay | Admitting: Genetics

## 2021-08-25 LAB — CULTURE, OB URINE

## 2021-08-25 LAB — URINE CULTURE, OB REFLEX

## 2021-08-25 MED ORDER — CEFADROXIL 500 MG PO CAPS: 500.0000 mg | ORAL_CAPSULE | Freq: Two times a day (BID) | ORAL | 0 refills | Status: DC

## 2021-08-25 NOTE — Addendum Note (Signed)
Addended by: Marny Lowenstein on: 08/25/2021 08:44 AM ? ? Modules accepted: Orders ? ?

## 2021-09-05 ENCOUNTER — Ambulatory Visit: Payer: Medicaid Other | Admitting: *Deleted

## 2021-09-05 ENCOUNTER — Ambulatory Visit: Payer: Medicaid Other | Attending: Obstetrics and Gynecology

## 2021-09-05 VITALS — BP 106/67 | HR 89

## 2021-09-05 DIAGNOSIS — Z3482 Encounter for supervision of other normal pregnancy, second trimester: Secondary | ICD-10-CM | POA: Insufficient documentation

## 2021-09-05 DIAGNOSIS — Z362 Encounter for other antenatal screening follow-up: Secondary | ICD-10-CM | POA: Diagnosis present

## 2021-09-05 DIAGNOSIS — O99210 Obesity complicating pregnancy, unspecified trimester: Secondary | ICD-10-CM

## 2021-09-05 DIAGNOSIS — Z148 Genetic carrier of other disease: Secondary | ICD-10-CM | POA: Insufficient documentation

## 2021-09-05 DIAGNOSIS — Z3A23 23 weeks gestation of pregnancy: Secondary | ICD-10-CM | POA: Insufficient documentation

## 2021-09-05 DIAGNOSIS — O321XX0 Maternal care for breech presentation, not applicable or unspecified: Secondary | ICD-10-CM | POA: Insufficient documentation

## 2021-09-14 ENCOUNTER — Encounter: Payer: Self-pay | Admitting: Emergency Medicine

## 2021-09-14 ENCOUNTER — Ambulatory Visit
Admission: EM | Admit: 2021-09-14 | Discharge: 2021-09-14 | Disposition: A | Discharge status: Home / Self Care | Payer: Medicaid Other | Attending: Urgent Care | Admitting: Urgent Care

## 2021-09-14 DIAGNOSIS — J069 Acute upper respiratory infection, unspecified: Secondary | ICD-10-CM | POA: Diagnosis not present

## 2021-09-14 MED ORDER — BUDESONIDE 32 MCG/ACT NA SUSP: 1.0000 | Freq: Two times a day (BID) | NASAL | 0 refills | Status: DC

## 2021-09-14 MED ORDER — CETIRIZINE HCL 10 MG PO TABS: 10.0000 mg | ORAL_TABLET | Freq: Every day | ORAL | 0 refills | Status: DC

## 2021-09-14 NOTE — Discharge Instructions (Addendum)
Your symptoms sound most consistent with a viral illness, likely rhinovirus. ?Supportive care is the mainstay of treatment - rest, hydration, frequent hand washing, tylenol as needed for fever or aches.  ?I called in rhinocort nasal spray for you, which is approved for use in pregnancy ?You can also take zyrtec, one tab daily. There is no risk of fetal harm, but can cause slight reduction in amniotic fluid so please take as little as possible for as short of a time as needed.  ?Call your OB for further recommendations. ?

## 2021-09-14 NOTE — ED Triage Notes (Addendum)
Pt is present today with ear ache, cough, and nasal congestion. Pt sx started Friday  ?

## 2021-09-14 NOTE — ED Provider Notes (Signed)
[facility]    CSN: [id number] Arrival date & time: 09/14/21  1307      History   Chief Complaint Chief Complaint  Patient presents with   Nasal Congestion   Sore Throat    HPI Nichole Finley is a 21 y.o. female.   Pleasant 20yo female presents today due to nasal congestion, sore throat, cough, sneezing, and ear pressure since Friday. She is currently 6 months pregnant. Has not tried any OTC medications for her symptoms. 3yo son with similar symptoms, who is not in daycare. Denies any sick contact exposures, fevers, shortness of breath, chest pain or wheezing.   Sore Throat   Past Medical History:  Diagnosis Date   Depression    Vaginal Pap smear, abnormal     Patient Active Problem List   Diagnosis Date Noted   Carrier of spinal muscular atrophy 07/11/2021   Maternal obesity affecting pregnancy, antepartum 06/13/2021   Supervision of normal pregnancy 06/03/2021   False positive HIV serology 03/03/2017   Severe single current episode of major depressive disorder, without psychotic features (HCC)    MDD (major depressive disorder), single episode, moderate (HCC) 10/06/2014   Attention deficit hyperactivity disorder (ADHD), combined type, severe 10/06/2014   Oppositional defiant disorder 10/06/2014    Past Surgical History:  Procedure Laterality Date   NO PAST SURGERIES      OB History     Gravida  2   Para  1   Term  1   Preterm      AB      Living  1      SAB      IAB      Ectopic      Multiple      Live Births  1            Home Medications    Prior to Admission medications   Medication Sig Start Date End Date Taking? Authorizing Provider  budesonide (RHINOCORT AQUA) 32 MCG/ACT nasal spray Place 1 spray into both nostrils in the morning and at bedtime. 09/14/21  Yes Charles Niese L, PA  cetirizine (ZYRTEC ALLERGY) 10 MG tablet Take 1 tablet (10 mg total) by mouth daily. 09/14/21  Yes Loel Betancur L, PA  Prenat-Fe  Poly-Methfol-FA-DHA (VITAFOL ULTRA) 29-0.6-0.4-200 MG CAPS Take 1 tablet by mouth daily. 06/13/21   Constant, Peggy, MD    Family History Family History  Problem Relation Age of Onset   Heart disease Maternal Grandfather    Heart disease Maternal Uncle     Social History Social History   Tobacco Use   Smoking status: Former    Types: Cigarettes    Quit date: 05/23/2017    Years since quitting: 4.3   Smokeless tobacco: Never   Tobacco comments:    before pregnancy  Vaping Use   Vaping Use: Former   Devices: stopped when pregnancy confirmed  Substance Use Topics   Alcohol use: No   Drug use: No     Allergies   Patient has no known allergies.   Review of Systems Review of Systems As per hpi  Physical Exam Triage Vital Signs ED Triage Vitals  Enc Vitals Group     BP 09/14/21 1321 102/66     Pulse Rate 09/14/21 1321 93     Resp 09/14/21 1321 14     Temp 09/14/21 1321 98.2 F (36.8 C)     Temp Source 09/14/21 1321 Oral     SpO2 09/14/21 1321 98 %  Weight --      Height --      Head Circumference --      Peak Flow --      Pain Score 09/14/21 1320 4     Pain Loc --      Pain Edu? --      Excl. in GC? --    No data found.  Updated Vital Signs BP 102/66 (BP Location: Left Arm)   Pulse 93   Temp 98.2 F (36.8 C) (Oral)   Resp 14   LMP 04/04/2021 (Approximate)   SpO2 98%   Visual Acuity Right Eye Distance:   Left Eye Distance:   Bilateral Distance:    Right Eye Near:   Left Eye Near:    Bilateral Near:     Physical Exam Vitals and nursing note reviewed.  Constitutional:      General: She is not in acute distress.    Appearance: She is well-developed and normal weight. She is not ill-appearing, toxic-appearing or diaphoretic.  HENT:     Head: Normocephalic and atraumatic.     Right Ear: Tympanic membrane and ear canal normal. No drainage, swelling or tenderness. No middle ear effusion. Tympanic membrane is not erythematous.     Left Ear:  Tympanic membrane and ear canal normal. No drainage, swelling or tenderness.  No middle ear effusion. Tympanic membrane is not erythematous.     Nose: Congestion and rhinorrhea present.     Mouth/Throat:     Mouth: Mucous membranes are moist. No oral lesions.     Pharynx: Oropharynx is clear. No pharyngeal swelling, oropharyngeal exudate, posterior oropharyngeal erythema or uvula swelling.     Tonsils: No tonsillar exudate or tonsillar abscesses.  Eyes:     Extraocular Movements:     Right eye: Normal extraocular motion.     Left eye: Normal extraocular motion.     Conjunctiva/sclera: Conjunctivae normal.     Pupils: Pupils are equal, round, and reactive to light.  Neck:     Thyroid: No thyromegaly.  Cardiovascular:     Rate and Rhythm: Normal rate and regular rhythm.     Heart sounds: Normal heart sounds. No murmur heard. Pulmonary:     Effort: Pulmonary effort is normal. No respiratory distress.     Breath sounds: Normal breath sounds. No stridor. No wheezing, rhonchi or rales.  Chest:     Chest wall: No tenderness.  Abdominal:     General: Bowel sounds are normal. There is no distension.     Palpations: Abdomen is soft. There is no mass.     Tenderness: There is no abdominal tenderness. There is no guarding or rebound.     Hernia: No hernia is present.  Musculoskeletal:     Cervical back: Normal range of motion and neck supple.  Lymphadenopathy:     Cervical: No cervical adenopathy.  Skin:    General: Skin is warm.     Capillary Refill: Capillary refill takes less than 2 seconds.     Coloration: Skin is not pale.     Findings: No erythema or rash.  Neurological:     General: No focal deficit present.     Mental Status: She is alert and oriented to person, place, and time.  Psychiatric:        Mood and Affect: Mood normal.        Behavior: Behavior normal.     UC Treatments / Results  Labs (all labs ordered are listed, but only abnormal  results are displayed) Labs  Reviewed - No data to display  EKG   Radiology No results found.  Procedures Procedures (including critical care time)  Medications Ordered in UC Medications - No data to display  Initial Impression / Assessment and Plan / UC Course  I have reviewed the triage vital signs and the nursing notes.  Pertinent labs & imaging results that were available during my care of the patient were reviewed by me and considered in my medical decision making (see chart for details).     Viral URI - discussed tx options during pregnancy. Will start with budesonide which is safe for pregnancy. Consider just a few days of zyrtec. This is safe for pregnancy; it is uncertain if there are negative affects to amniotic fluid and thus should not be used for long periods of time. Tylenol if needed for aches or pains. Humidification and increased water intake. Follow up with OB if symptoms perisist >14 days  Final Clinical Impressions(s) / UC Diagnoses   Final diagnoses:  Viral upper respiratory infection     Discharge Instructions      Your symptoms sound most consistent with a viral illness, likely rhinovirus. Supportive care is the mainstay of treatment - rest, hydration, frequent hand washing, tylenol as needed for fever or aches.  I called in rhinocort nasal spray for you, which is approved for use in pregnancy You can also take zyrtec, one tab daily. There is no risk of fetal harm, but can cause slight reduction in amniotic fluid so please take as little as possible for as short of a time as needed.  Call your OB for further recommendations.   ED Prescriptions     Medication Sig Dispense Auth. Provider   budesonide (RHINOCORT AQUA) 32 MCG/ACT nasal spray Place 1 spray into both nostrils in the morning and at bedtime. 5 mL Ethylene Reznick L, PA   cetirizine (ZYRTEC ALLERGY) 10 MG tablet Take 1 tablet (10 mg total) by mouth daily. 30 tablet Reighlynn Swiney L, [place]      PDMP not reviewed this  encounter.

## 2021-09-17 ENCOUNTER — Inpatient Hospital Stay (HOSPITAL_COMMUNITY)
Admission: AD | Admit: 2021-09-17 | Discharge: 2021-09-17 | Disposition: A | Discharge status: Home / Self Care | Payer: Medicaid Other | Attending: Obstetrics & Gynecology | Admitting: Obstetrics & Gynecology

## 2021-09-17 ENCOUNTER — Other Ambulatory Visit: Payer: Self-pay

## 2021-09-17 ENCOUNTER — Encounter (HOSPITAL_COMMUNITY): Payer: Self-pay | Admitting: Obstetrics & Gynecology

## 2021-09-17 DIAGNOSIS — O2342 Unspecified infection of urinary tract in pregnancy, second trimester: Secondary | ICD-10-CM

## 2021-09-17 DIAGNOSIS — Z3A24 24 weeks gestation of pregnancy: Secondary | ICD-10-CM | POA: Diagnosis not present

## 2021-09-17 DIAGNOSIS — Z792 Long term (current) use of antibiotics: Secondary | ICD-10-CM | POA: Insufficient documentation

## 2021-09-17 DIAGNOSIS — O26892 Other specified pregnancy related conditions, second trimester: Secondary | ICD-10-CM | POA: Insufficient documentation

## 2021-09-17 DIAGNOSIS — Z20822 Contact with and (suspected) exposure to covid-19: Secondary | ICD-10-CM | POA: Insufficient documentation

## 2021-09-17 DIAGNOSIS — R300 Dysuria: Secondary | ICD-10-CM | POA: Diagnosis not present

## 2021-09-17 LAB — URINALYSIS, ROUTINE W REFLEX MICROSCOPIC
Bilirubin Urine: NEGATIVE
Glucose, UA: NEGATIVE mg/dL
Ketones, ur: 20 mg/dL — AB
Nitrite: POSITIVE — AB
Protein, ur: NEGATIVE mg/dL
RBC / HPF: >50 RBC/hpf — ABNORMAL HIGH (ref 0–5)
Specific Gravity, Urine: 1.018 (ref 1.005–1.030)
pH: 5 (ref 5.0–8.0)

## 2021-09-17 LAB — CBC WITH DIFFERENTIAL/PLATELET
Abs Immature Granulocytes: 0.04 10*3/uL (ref 0.00–0.07)
Basophils Absolute: 0 10*3/uL (ref 0.0–0.1)
Basophils Relative: 0 %
Eosinophils Absolute: 0 10*3/uL (ref 0.0–0.5)
Eosinophils Relative: 0 %
HCT: 37.4 % (ref 36.0–46.0)
Hemoglobin: 13.2 g/dL (ref 12.0–15.0)
Immature Granulocytes: 0 %
Lymphocytes Relative: 3 %
Lymphs Abs: 0.3 10*3/uL — ABNORMAL LOW (ref 0.7–4.0)
MCH: 31.9 pg (ref 26.0–34.0)
MCHC: 35.3 g/dL (ref 30.0–36.0)
MCV: 90.3 fL (ref 80.0–100.0)
Monocytes Absolute: 0.3 10*3/uL (ref 0.1–1.0)
Monocytes Relative: 3 %
Neutro Abs: 9.9 10*3/uL — ABNORMAL HIGH (ref 1.7–7.7)
Neutrophils Relative %: 94 %
Platelets: 151 10*3/uL (ref 150–400)
RBC: 4.14 MIL/uL (ref 3.87–5.11)
RDW: 12.6 % (ref 11.5–15.5)
WBC: 10.5 10*3/uL (ref 4.0–10.5)
nRBC: 0 % (ref 0.0–0.2)

## 2021-09-17 LAB — RESP PANEL BY RT-PCR (FLU A&B, COVID) ARPGX2
Influenza A by PCR: NEGATIVE
Influenza B by PCR: NEGATIVE
SARS Coronavirus 2 by RT PCR: NEGATIVE

## 2021-09-17 MED ORDER — ACETAMINOPHEN 500 MG PO TABS
1000.0000 mg | ORAL_TABLET | Freq: Once | ORAL | Status: AC
  Administered 2021-09-17: 1000 mg via ORAL
  Filled 2021-09-17: qty 2

## 2021-09-17 MED ORDER — METOCLOPRAMIDE HCL 10 MG PO TABS
10.0000 mg | ORAL_TABLET | Freq: Once | ORAL | Status: AC
  Administered 2021-09-17: 10 mg via ORAL
  Filled 2021-09-17: qty 1

## 2021-09-17 MED ORDER — ONDANSETRON 8 MG PO TBDP
8.0000 mg | ORAL_TABLET | Freq: Three times a day (TID) | ORAL | 0 refills | Status: DC | PRN
Start: 2021-09-17 — End: 2021-10-24

## 2021-09-17 MED ORDER — CEFADROXIL 500 MG PO CAPS: 500.0000 mg | ORAL_CAPSULE | Freq: Two times a day (BID) | ORAL | 0 refills | Status: AC

## 2021-09-17 NOTE — MAU Note (Signed)
Nichole Finley is a 21 y.o. at [redacted]w[redacted]d here in MAU reporting: pelvic pain, chills, H/A, and body aches.  Reports symptoms began last night. Denies VB or LOF, states was spotting light pink with wiping earlier this morning.  Endorses +FM.  Onset of complaint: yesterday Pain score: 10/10 - H/A & 3/10 - pelvic pain Vitals:   09/17/21 1212  BP: (!) 115/56  Pulse: (!) 107  Resp: 18  Temp: 98.3 F (36.8 C)  SpO2: 100%     FHT:166 bpm Lab orders placed from triage:   UA

## 2021-09-17 NOTE — Discharge Instructions (Signed)

## 2021-09-17 NOTE — MAU Provider Note (Signed)
History     CSN: [id number]  Arrival date and time: 09/17/21 1117   Event Date/Time   First Provider Initiated Contact with Patient 09/17/21 1237      Chief Complaint  Patient presents with   Pelvic Pain   Chills   Headache   Body aches   HPI Nichole Finley is a 21 y.o. G2P1001 at [redacted]w[redacted]d who presents with dysuria. She reports last night she started having pain when she urinates and going to the bathroom more frequently. She states she saw blood when she wiped. She reports she was treated for a UTI 4/27 and did not complete the antibiotics. She denies any fever at home but did not check it and reports feeling chills. Last intercourse was last night.   She also reports nasal congestion and cough last week. Denies any symptoms currently.   OB History     Gravida  2   Para  1   Term  1   Preterm      AB      Living  1      SAB      IAB      Ectopic      Multiple      Live Births  1           Past Medical History:  Diagnosis Date   Depression    Vaginal Pap smear, abnormal     Past Surgical History:  Procedure Laterality Date   NO PAST SURGERIES      Family History  Problem Relation Age of Onset   Heart disease Maternal Grandfather    Heart disease Maternal Uncle     Social History   Tobacco Use   Smoking status: Former    Types: Cigarettes    Quit date: 05/23/2017    Years since quitting: 4.3   Smokeless tobacco: Never   Tobacco comments:    before pregnancy  Vaping Use   Vaping Use: Former   Devices: stopped when pregnancy confirmed  Substance Use Topics   Alcohol use: No   Drug use: No    Allergies: No Known Allergies  Medications Prior to Admission  Medication Sig Dispense Refill Last Dose   budesonide (RHINOCORT AQUA) 32 MCG/ACT nasal spray Place 1 spray into both nostrils in the morning and at bedtime. 5 mL 0 09/17/2021   Prenat-Fe Poly-Methfol-FA-DHA (VITAFOL ULTRA) 29-0.6-0.4-200 MG CAPS Take 1 tablet by mouth daily. 30  capsule 12 09/16/2021   cetirizine (ZYRTEC ALLERGY) 10 MG tablet Take 1 tablet (10 mg total) by mouth daily. 30 tablet 0     Review of Systems  Constitutional: Negative.  Negative for fatigue and fever.  HENT: Negative.    Respiratory: Negative.  Negative for shortness of breath.   Cardiovascular: Negative.  Negative for chest pain.  Gastrointestinal: Negative.  Negative for abdominal pain, constipation, diarrhea, nausea and vomiting.  Genitourinary:  Positive for dysuria, frequency and pelvic pain. Negative for flank pain, vaginal bleeding and vaginal discharge.  Neurological: Negative.  Negative for dizziness and headaches.  Physical Exam   Blood pressure (!) 115/56, pulse (!) 107, temperature 98.3 F (36.8 C), temperature source Oral, resp. rate 18, height 5\' 8"  (1.727 m), weight 88.6 kg, last menstrual period 04/04/2021, SpO2 100 %.  Physical Exam Vitals and nursing note reviewed.  Constitutional:      General: She is not in acute distress.    Appearance: She is well-developed.  HENT:     Head: Normocephalic.  Eyes:     Pupils: Pupils are equal, round, and reactive to light.  Cardiovascular:     Rate and Rhythm: Normal rate and regular rhythm.     Heart sounds: Normal heart sounds.  Pulmonary:     Effort: Pulmonary effort is normal. No respiratory distress.     Breath sounds: Normal breath sounds.  Abdominal:     General: Bowel sounds are normal. There is no distension.     Palpations: Abdomen is soft.     Tenderness: There is no abdominal tenderness. There is no right CVA tenderness or left CVA tenderness.  Skin:    General: Skin is warm and dry.  Neurological:     Mental Status: She is alert and oriented to person, place, and time.  Psychiatric:        Mood and Affect: Mood normal.        Behavior: Behavior normal.        Thought Content: Thought content normal.        Judgment: Judgment normal.   Fetal Tracing:  Baseline: 150 Variability: moderate Accels:  10x10 Decels: none  Toco: none  MAU Course  Procedures Results for orders placed or performed during the hospital encounter of 09/17/21 (from the past 24 hour(s))  Urinalysis, Routine w reflex microscopic Urine, Clean Catch     Status: Abnormal   Collection Time: 09/17/21 12:15 PM  Result Value Ref Range   Color, Urine AMBER (A) YELLOW   APPearance HAZY (A) CLEAR   Specific Gravity, Urine 1.018 1.005 - 1.030   pH 5.0 5.0 - 8.0   Glucose, UA NEGATIVE NEGATIVE mg/dL   Hgb urine dipstick MODERATE (A) NEGATIVE   Bilirubin Urine NEGATIVE NEGATIVE   Ketones, ur 20 (A) NEGATIVE mg/dL   Protein, ur NEGATIVE NEGATIVE mg/dL   Nitrite POSITIVE (A) NEGATIVE   Leukocytes,Ua MODERATE (A) NEGATIVE   RBC / HPF >50 (H) 0 - 5 RBC/hpf   WBC, UA 21-50 0 - 5 WBC/hpf   Bacteria, UA FEW (A) NONE SEEN   Squamous Epithelial / LPF 0-5 0 - 5   Mucus PRESENT   Resp Panel by RT-PCR (Flu A&B, Covid) Nasopharyngeal Swab     Status: None   Collection Time: 09/17/21 12:30 PM   Specimen: Nasopharyngeal Swab; Nasopharyngeal(NP) swabs in vial transport medium  Result Value Ref Range   SARS Coronavirus 2 by RT PCR NEGATIVE NEGATIVE   Influenza A by PCR NEGATIVE NEGATIVE   Influenza B by PCR NEGATIVE NEGATIVE  CBC with Differential/Platelet     Status: Abnormal   Collection Time: 09/17/21 12:48 PM  Result Value Ref Range   WBC 10.5 4.0 - 10.5 K/uL   RBC 4.14 3.87 - 5.11 MIL/uL   Hemoglobin 13.2 12.0 - 15.0 g/dL   HCT 19.137.4 47.836.0 - 29.546.0 %   MCV 90.3 80.0 - 100.0 fL   MCH 31.9 26.0 - 34.0 pg   MCHC 35.3 30.0 - 36.0 g/dL   RDW 62.112.6 30.811.5 - 65.715.5 %   Platelets 151 150 - 400 K/uL   nRBC 0.0 0.0 - 0.2 %   Neutrophils Relative % 94 %   Neutro Abs 9.9 (H) 1.7 - 7.7 K/uL   Lymphocytes Relative 3 %   Lymphs Abs 0.3 (L) 0.7 - 4.0 K/uL   Monocytes Relative 3 %   Monocytes Absolute 0.3 0.1 - 1.0 K/uL   Eosinophils Relative 0 %   Eosinophils Absolute 0.0 0.0 - 0.5 K/uL   Basophils Relative 0 %  Basophils Absolute  0.0 0.0 - 0.1 K/uL   Immature Granulocytes 0 %   Abs Immature Granulocytes 0.04 0.00 - 0.07 K/uL    MDM UA COVID test CBC with Diff  CNM discussed importance of completing antibiotic course to prevent pyelonephritis.   Assessment and Plan   1. Urinary tract infection in mother during second trimester of pregnancy   2. [redacted] weeks gestation of pregnancy    -Discharge home in stable condition -Rx for duracef sent to patient's pharmacy -Pyelonephritis precautions discussed -Patient advised to follow-up with OB as scheduled for prenatal care -Patient may return to MAU as needed or if her condition were to change or worsen   Rolm Bookbinder CNM 09/17/2021, 12:37 PM

## 2021-09-19 ENCOUNTER — Ambulatory Visit (INDEPENDENT_AMBULATORY_CARE_PROVIDER_SITE_OTHER): Payer: Medicaid Other | Admitting: Advanced Practice Midwife

## 2021-09-19 VITALS — BP 116/70 | HR 97 | Wt 195.6 lb

## 2021-09-19 DIAGNOSIS — Z3480 Encounter for supervision of other normal pregnancy, unspecified trimester: Secondary | ICD-10-CM

## 2021-09-19 DIAGNOSIS — O2342 Unspecified infection of urinary tract in pregnancy, second trimester: Secondary | ICD-10-CM

## 2021-09-19 DIAGNOSIS — O99210 Obesity complicating pregnancy, unspecified trimester: Secondary | ICD-10-CM

## 2021-09-19 DIAGNOSIS — Z3A25 25 weeks gestation of pregnancy: Secondary | ICD-10-CM

## 2021-09-19 NOTE — Progress Notes (Signed)
   PRENATAL VISIT NOTE  Subjective:  Nichole Finley is a 21 y.o. G2P1001 at [redacted]w[redacted]d being seen today for ongoing prenatal care.  She is currently monitored for the following issues for this low-risk pregnancy and has MDD (major depressive disorder), single episode, moderate ([place]); Attention deficit hyperactivity disorder (ADHD), combined type, severe; Oppositional defiant disorder; Severe single current episode of major depressive disorder, without psychotic features ([place]); False positive HIV serology; Supervision of normal pregnancy; Maternal obesity affecting pregnancy, antepartum; and Carrier of spinal muscular atrophy on their problem list.  Patient reports no complaints.  Contractions: Not present. Vag. Bleeding: None.  Movement: Present. Denies leaking of fluid.   The following portions of the patient's history were reviewed and updated as appropriate: allergies, current medications, past family history, past medical history, past social history, past surgical history and problem list.   Objective:   Vitals:   09/19/21 1342  BP: 116/70  Pulse: 97  Weight: 195 lb 9.6 oz (88.7 kg)    Fetal Status: Fetal Heart Rate (bpm): 145 Fundal Height: 26 cm Movement: Present     General:  Alert, oriented and cooperative. Patient is in no acute distress.  Skin: Skin is warm and dry. No rash noted.   Cardiovascular: Normal heart rate noted  Respiratory: Normal respiratory effort, no problems with respiration noted  Abdomen: Soft, gravid, appropriate for gestational age.  Pain/Pressure: Absent     Pelvic: Cervical exam deferred        Extremities: Normal range of motion.  Edema: Trace  Mental Status: Normal mood and affect. Normal behavior. Normal judgment and thought content.   Assessment and Plan:  Pregnancy: G2P1001 at [redacted]w[redacted]d 1. Supervision of other normal pregnancy, antepartum --Anticipatory guidance about next visits/weeks of pregnancy given.   2. UTI (urinary tract infection) during  pregnancy, second trimester --Retreated 5/20  --TOC next visit  3. [redacted] weeks gestation of pregnancy   Preterm labor symptoms and general obstetric precautions including but not limited to vaginal bleeding, contractions, leaking of fluid and fetal movement were reviewed in detail with the patient. Please refer to After Visit Summary for other counseling recommendations.   Return for GTT at next visit, LOB, Any provider.  No future appointments.   Fatima Blank, CNM

## 2021-09-19 NOTE — Progress Notes (Signed)
Pt reports fetal movement, denies pain.  

## 2021-10-14 ENCOUNTER — Encounter: Payer: Self-pay | Admitting: Advanced Practice Midwife

## 2021-10-14 ENCOUNTER — Ambulatory Visit (INDEPENDENT_AMBULATORY_CARE_PROVIDER_SITE_OTHER): Payer: Medicaid Other | Admitting: Advanced Practice Midwife

## 2021-10-14 ENCOUNTER — Other Ambulatory Visit: Payer: Medicaid Other

## 2021-10-14 VITALS — BP 122/76 | HR 90 | Wt 199.0 lb

## 2021-10-14 DIAGNOSIS — Z3483 Encounter for supervision of other normal pregnancy, third trimester: Secondary | ICD-10-CM

## 2021-10-14 DIAGNOSIS — Z23 Encounter for immunization: Secondary | ICD-10-CM

## 2021-10-14 DIAGNOSIS — Z3A28 28 weeks gestation of pregnancy: Secondary | ICD-10-CM

## 2021-10-14 NOTE — Progress Notes (Addendum)
   PRENATAL VISIT NOTE  Subjective:  Nichole Finley is a 21 y.o. G2P1001 at [redacted]w[redacted]d being seen today for ongoing prenatal care.  She is currently monitored for the following issues for this low-risk pregnancy and has MDD (major depressive disorder), single episode, moderate ([facility]); Attention deficit hyperactivity disorder (ADHD), combined type, severe; Oppositional defiant disorder; Severe single current episode of major depressive disorder, without psychotic features ([facility]); False positive HIV serology; Supervision of normal pregnancy; Maternal obesity affecting pregnancy, antepartum; and Carrier of spinal muscular atrophy on their problem list.  Patient reports no complaints.  Contractions: Not present. Vag. Bleeding: None.  Movement: Present. Denies leaking of fluid.   The following portions of the patient's history were reviewed and updated as appropriate: allergies, current medications, past family history, past medical history, past social history, past surgical history and problem list.   Objective:   Vitals:   10/14/21 0930  BP: 122/76  Pulse: 90  Weight: 199 lb (90.3 kg)    Fetal Status: Fetal Heart Rate (bpm): 150  Fundal Height: 28 cm Movement: Present     General:  Alert, oriented and cooperative. Patient is in no acute distress.  Skin: Skin is warm and dry. No rash noted.   Cardiovascular: Normal heart rate noted  Respiratory: Normal respiratory effort, no problems with respiration noted  Abdomen: Soft, gravid, appropriate for gestational age.  Pain/Pressure: Present     Pelvic: Cervical exam deferred        Extremities: Normal range of motion.  Edema: Trace  Mental Status: Normal mood and affect. Normal behavior. Normal judgment and thought content.   Assessment and Plan:  Pregnancy: G2P1001 at [redacted]w[redacted]d 1. Encounter for supervision of other normal pregnancy in third trimester   2. [redacted] weeks gestation of pregnancy - Glucose Tolerance, 2 Hours w/1 Hour - HIV Antibody (routine  testing w rflx) - RPR - CBC - Culture, OB Urine  Preterm labor symptoms and general obstetric precautions including but not limited to vaginal bleeding, contractions, leaking of fluid and fetal movement were reviewed in detail with the patient. Please refer to After Visit Summary for other counseling recommendations.   Return in about 2 weeks (around 10/28/2021).  No future appointments.  Thressa Sheller DNP, CNM  10/14/21  10:34 AM

## 2021-10-14 NOTE — Progress Notes (Addendum)
Pt presents for ROB and 2 gtt labs. Tdap given R  Del Completed ABx for UTI  Pt was unable to void prior to leaving - collect NV.

## 2021-10-14 NOTE — Addendum Note (Signed)
Addended by: Dalphine Handing on: 10/14/2021 11:40 AM   Modules accepted: Orders

## 2021-10-15 LAB — CBC
Hematocrit: 35.3 % (ref 34.0–46.6)
Hemoglobin: 12.2 g/dL (ref 11.1–15.9)
MCH: 31.8 pg (ref 26.6–33.0)
MCHC: 34.6 g/dL (ref 31.5–35.7)
MCV: 92 fL (ref 79–97)
Platelets: 168 10*3/uL (ref 150–450)
RBC: 3.84 x10E6/uL (ref 3.77–5.28)
RDW: 12.2 % (ref 11.7–15.4)
WBC: 8.3 10*3/uL (ref 3.4–10.8)

## 2021-10-15 LAB — HIV ANTIBODY (ROUTINE TESTING W REFLEX): HIV Screen 4th Generation wRfx: NONREACTIVE

## 2021-10-15 LAB — RPR: RPR Ser Ql: NONREACTIVE

## 2021-10-21 ENCOUNTER — Emergency Department (HOSPITAL_COMMUNITY)
Admission: EM | Admit: 2021-10-21 | Discharge: 2021-10-21 | Disposition: A | Discharge status: Home / Self Care | Payer: Medicaid Other | Attending: Emergency Medicine | Admitting: Emergency Medicine

## 2021-10-21 ENCOUNTER — Other Ambulatory Visit: Payer: Self-pay

## 2021-10-21 ENCOUNTER — Encounter (HOSPITAL_COMMUNITY): Payer: Self-pay

## 2021-10-21 DIAGNOSIS — N9489 Other specified conditions associated with female genital organs and menstrual cycle: Secondary | ICD-10-CM | POA: Insufficient documentation

## 2021-10-21 DIAGNOSIS — O26893 Other specified pregnancy related conditions, third trimester: Secondary | ICD-10-CM | POA: Insufficient documentation

## 2021-10-21 DIAGNOSIS — Z3A28 28 weeks gestation of pregnancy: Secondary | ICD-10-CM | POA: Insufficient documentation

## 2021-10-21 DIAGNOSIS — R55 Syncope and collapse: Secondary | ICD-10-CM | POA: Insufficient documentation

## 2021-10-21 DIAGNOSIS — E162 Hypoglycemia, unspecified: Secondary | ICD-10-CM | POA: Diagnosis not present

## 2021-10-21 DIAGNOSIS — E876 Hypokalemia: Secondary | ICD-10-CM | POA: Diagnosis not present

## 2021-10-21 LAB — URINALYSIS, ROUTINE W REFLEX MICROSCOPIC
Bilirubin Urine: NEGATIVE
Glucose, UA: NEGATIVE mg/dL
Hgb urine dipstick: NEGATIVE
Ketones, ur: 5 mg/dL — AB
Leukocytes,Ua: NEGATIVE
Nitrite: NEGATIVE
Protein, ur: 30 mg/dL — AB
Specific Gravity, Urine: 1.02 (ref 1.005–1.030)
pH: 6 (ref 5.0–8.0)

## 2021-10-21 LAB — CBG MONITORING, ED
Glucose-Capillary: 95 mg/dL (ref 70–99)
Glucose-Capillary: 98 mg/dL (ref 70–99)

## 2021-10-21 LAB — I-STAT BETA HCG BLOOD, ED (MC, WL, AP ONLY): I-stat hCG, quantitative: >2000 m[IU]/mL — ABNORMAL HIGH (ref <5)

## 2021-10-21 LAB — BASIC METABOLIC PANEL
Anion gap: 7 (ref 5–15)
BUN: 6 mg/dL (ref 6–20)
CO2: 17 mmol/L — ABNORMAL LOW (ref 22–32)
Calcium: 7.1 mg/dL — ABNORMAL LOW (ref 8.9–10.3)
Chloride: 115 mmol/L — ABNORMAL HIGH (ref 98–111)
Creatinine, Ser: 0.51 mg/dL (ref 0.44–1.00)
GFR, Estimated: >60 mL/min (ref >60)
Glucose, Bld: 68 mg/dL — ABNORMAL LOW (ref 70–99)
Potassium: 2.7 mmol/L — CL (ref 3.5–5.1)
Sodium: 139 mmol/L (ref 135–145)

## 2021-10-21 LAB — CBC
HCT: 34.3 % — ABNORMAL LOW (ref 36.0–46.0)
Hemoglobin: 11.8 g/dL — ABNORMAL LOW (ref 12.0–15.0)
MCH: 32.5 pg (ref 26.0–34.0)
MCHC: 34.4 g/dL (ref 30.0–36.0)
MCV: 94.5 fL (ref 80.0–100.0)
Platelets: 129 10*3/uL — ABNORMAL LOW (ref 150–400)
RBC: 3.63 MIL/uL — ABNORMAL LOW (ref 3.87–5.11)
RDW: 13.3 % (ref 11.5–15.5)
WBC: 8.7 10*3/uL (ref 4.0–10.5)
nRBC: 0 % (ref 0.0–0.2)

## 2021-10-21 MED ORDER — POTASSIUM CHLORIDE 10 MEQ/100ML IV SOLN
10.0000 meq | INTRAVENOUS | Status: AC
  Administered 2021-10-21 (×4): 10 meq via INTRAVENOUS
  Filled 2021-10-21 (×4): qty 100

## 2021-10-21 MED ORDER — POTASSIUM CHLORIDE CRYS ER 20 MEQ PO TBCR
40.0000 meq | EXTENDED_RELEASE_TABLET | Freq: Once | ORAL | Status: AC
Start: 2021-10-21 — End: 2021-10-21
  Administered 2021-10-21: 40 meq via ORAL
  Filled 2021-10-21: qty 2

## 2021-10-21 MED ORDER — SODIUM CHLORIDE 0.9 % IV BOLUS
1000.0000 mL | Freq: Once | INTRAVENOUS | Status: AC
  Administered 2021-10-21: 1000 mL via INTRAVENOUS

## 2021-10-21 MED ORDER — POTASSIUM CHLORIDE CRYS ER 20 MEQ PO TBCR: 20.0000 meq | EXTENDED_RELEASE_TABLET | Freq: Every day | ORAL | 0 refills | Status: DC

## 2021-10-21 NOTE — ED Notes (Signed)
Secretary has called for [facility].

## 2021-10-21 NOTE — ED Notes (Signed)
MD notified of critical potassium of 2.7

## 2021-10-21 NOTE — Progress Notes (Signed)
Dr Shawnie Pons notified of pt, MD says to monitor fhr for and if its reassuring, pt may be OB cleared.

## 2021-10-21 NOTE — ED Provider Notes (Signed)
[facility] EMERGENCY DEPARTMENT Provider Note   CSN: [id number] Arrival date & time: 10/21/21  1439     History  Chief Complaint  Patient presents with   Loss of Consciousness    Nichole Finley is a 21 y.o. female.  The history is provided by the patient, medical records and the EMS personnel. No language interpreter was used.  Loss of Consciousness   This is a 21 year old female G2P1 who is [redacted] weeks pregnant brought here via EMS from work for evaluation of a syncopal episode.  Patient reports she was at work when she felt hot and flushed.  She tries drinking water but felt that she is passing out.  Coworker was able to help her on the ground and she reported having a brief witnessed syncopal episode.  She is now back to her normal baseline.  She does not endorse any headache, heart palpitation, chest pain, abdominal pain, abnormal vaginal bleeding, focal numbness focal weakness or confusion.  She denies any tongue biting or bowel bladder incontinence.  No history of seizure.  She did not eat today but denies feeling hungry.  She reported having similar syncope/near syncope episodes in the past when she was [redacted] weeks pregnant.  She has had a confirmed IUP.  Home Medications Prior to Admission medications   Medication Sig Start Date End Date Taking? Authorizing Provider  budesonide (RHINOCORT AQUA) 32 MCG/ACT nasal spray Place 1 spray into both nostrils in the morning and at bedtime. 09/14/21   Crain, Whitney L, PA  cetirizine (ZYRTEC ALLERGY) 10 MG tablet Take 1 tablet (10 mg total) by mouth daily. 09/14/21   Crain, Whitney L, PA  ondansetron (ZOFRAN-ODT) 8 MG disintegrating tablet Take 1 tablet (8 mg total) by mouth every 8 (eight) hours as needed for nausea or vomiting. Patient not taking: Reported on 10/14/2021 09/17/21   Rolm Bookbinder, CNM  Prenat-Fe Poly-Methfol-FA-DHA (VITAFOL ULTRA) 29-0.6-0.4-200 MG CAPS Take 1 tablet by mouth daily. 06/13/21   Constant, Peggy, MD       Allergies    Patient has no known allergies.    Review of Systems   Review of Systems  Cardiovascular:  Positive for syncope.  All other systems reviewed and are negative.   Physical Exam Updated Vital Signs BP (!) 115/57   Pulse 79   Temp 98.8 F (37.1 C) (Oral)   Resp 18   Wt 90.7 kg   LMP 04/04/2021 (Approximate)   SpO2 100%   BMI 30.41 kg/m  Physical Exam Vitals and nursing note reviewed.  Constitutional:      General: She is not in acute distress.    Appearance: She is well-developed.  HENT:     Head: Atraumatic.  Eyes:     Conjunctiva/sclera: Conjunctivae normal.  Cardiovascular:     Rate and Rhythm: Normal rate and regular rhythm.  Pulmonary:     Effort: Pulmonary effort is normal.  Abdominal:     Comments: Gravid abdomen nontender to palpation.  Musculoskeletal:     Cervical back: Neck supple.  Skin:    Findings: No rash.  Neurological:     Mental Status: She is alert and oriented to person, place, and time.     GCS: GCS eye subscore is 4. GCS verbal subscore is 5. GCS motor subscore is 6.     Sensory: Sensation is intact.     Motor: Motor function is intact.  Psychiatric:        Mood and Affect: Mood normal.  ED Results / Procedures / Treatments   Labs (all labs ordered are listed, but only abnormal results are displayed) Labs Reviewed  CBC - Abnormal; Notable for the following components:      Result Value   RBC 3.63 (*)    Hemoglobin 11.8 (*)    HCT 34.3 (*)    Platelets 129 (*)    All other components within normal limits  URINALYSIS, ROUTINE W REFLEX MICROSCOPIC - Abnormal; Notable for the following components:   APPearance HAZY (*)    Ketones, ur 5 (*)    Protein, ur 30 (*)    Bacteria, UA FEW (*)    All other components within normal limits  BASIC METABOLIC PANEL - Abnormal; Notable for the following components:   Potassium 2.7 (*)    Chloride 115 (*)    CO2 17 (*)    Glucose, Bld 68 (*)    Calcium 7.1 (*)    All other  components within normal limits  I-STAT BETA HCG BLOOD, ED ([facility], [facility], [facility] ONLY) - Abnormal; Notable for the following components:   I-stat hCG, quantitative >2,000.0 (*)    All other components within normal limits  CBG MONITORING, ED  CBG MONITORING, ED    EKG EKG Interpretation  Date/Time:  Friday October 21 2021 16:15:07 EDT Ventricular Rate:  77 PR Interval:  172 QRS Duration: 87 QT Interval:  382 QTC Calculation: 433 R Axis:   40 Text Interpretation: Sinus rhythm Confirmed by Madalyn Rob [phone]) on 10/21/2021 4:33:28 PM  Radiology No results found.  Procedures .Critical Care  Performed by: Domenic Moras, PA-C Authorized by: Domenic Moras, PA-C   Critical care provider statement:    Critical care time (minutes):  35   Critical care was time spent personally by me on the following activities:  Development of treatment plan with patient or surrogate, discussions with consultants, evaluation of patient's response to treatment, examination of patient, ordering and review of laboratory studies, ordering and review of radiographic studies, ordering and performing treatments and interventions, pulse oximetry, re-evaluation of patient's condition and review of old charts     Medications Ordered in ED Medications  potassium chloride 10 mEq in 100 mL IVPB (10 mEq Intravenous New Bag/Given 10/21/21 2116)  sodium chloride 0.9 % bolus 1,000 mL (0 mLs Intravenous Stopped 10/21/21 1736)  potassium chloride SA (KLOR-CON M) CR tablet 40 mEq (40 mEq Oral Given 10/21/21 1802)    ED Course/ Medical Decision Making/ A&P                           Medical Decision Making Amount and/or Complexity of Data Reviewed Labs: ordered.  Risk Prescription drug management.   BP (!) 115/57   Pulse 79   Temp 98.8 F (37.1 C) (Oral)   Resp 18   Wt 90.7 kg   LMP 04/04/2021 (Approximate)   SpO2 100%   BMI 30.41 kg/m   3:22 PM This is a 21 year old G2, P19 female who is currently [redacted] weeks pregnant  who had a witnessed syncopal episode at work.  She did endorse some prodromal symptoms prior to her syncope.  Reports feeling flushed and lightheadedness.  She is back to her baseline.  No seizure activity.  She reported having 1 similar episode while she was [redacted] weeks pregnant.  She has had confirmed IUP.  She reports that her pregnancy has been normal leading up to this event.  On exam this is a well-appearing  female appears to be in no acute discomfort.  Heart lung sounds normal.  Abdomen is nontender to palpation.  Blood pressure is 115/57, she is afebrile she is not tachycardic, she is not hypoxic.  Work up initiated.   5:34 PM Labs and EKG obtained, independently viewed interpreted by me.  Labs remarkable for a potassium of 2.7 without EKG changes.  Will provide supplementation which will include p.o. potassium as well as IV runs of potassium.  Patient also found to be hypoglycemic with a CBG of 68.  We will give food and drink and will recheck.  6:09 PM Tonny Bollman nurse has seen and evaluated patient, report no concerns from a pregnancy standpoint.   This patient presents to the ED for concern of syncope, this involves an extensive number of treatment options, and is a complaint that carries with it a high risk of complications and morbidity.  The differential diagnosis includes vasovagal syncope, anemia, hypovolemia, ectopic pregnancy, stroke, cardiac arrhythmia  Co morbidities that complicate the patient evaluation pregnancy Additional history obtained:  Additional history obtained from family member External records from outside source obtained and reviewed including notes from OBGYN  Lab Tests:  I Ordered, and personally interpreted labs.  The pertinent results include:  as above   Cardiac Monitoring:  The patient was maintained on a cardiac monitor.  I personally viewed and interpreted the cardiac monitored which showed an underlying rhythm of: NSR  Medicines ordered and  prescription drug management:  I ordered medication including IVF  for syncope Reevaluation of the patient after these medicines showed that the patient improved I have reviewed the patients home medicines and have made adjustments as needed  Test Considered: FHM; will have rapid OB nurse to perform  Critical Interventions: IVF  Consultations Obtained:  I requested consultation with the attending Dr. Stevie Kern,  and discussed lab and imaging findings as well as pertinent plan - they recommend: outpt f/u  Problem List / ED Course: syncope  Reevaluation:  After the interventions noted above, I reevaluated the patient and found that they have :improved  Social Determinants of Health: hx of depression and tobacco use  Dispostion:  After consideration of the diagnostic results and the patients response to treatment, I feel that the patent would benefit from outpt f/u.         Final Clinical Impression(s) / ED Diagnoses Final diagnoses:  Syncope and collapse  Hypokalemia  [redacted] weeks gestation of pregnancy  Hypoglycemia    Rx / DC Orders ED Discharge Orders     None         Fayrene Helper, PA-C 10/21/21 2232    Molpus, Jonny Ruiz, MD 10/27/21 2233

## 2021-10-24 ENCOUNTER — Other Ambulatory Visit: Payer: Self-pay | Admitting: Family Medicine

## 2021-10-24 ENCOUNTER — Inpatient Hospital Stay (HOSPITAL_COMMUNITY)
Admission: AD | Admit: 2021-10-24 | Discharge: 2021-10-24 | Disposition: A | Discharge status: Home / Self Care | Payer: Medicaid Other | Attending: Family Medicine | Admitting: Family Medicine

## 2021-10-24 ENCOUNTER — Encounter (HOSPITAL_COMMUNITY): Payer: Self-pay | Admitting: Family Medicine

## 2021-10-24 DIAGNOSIS — Z3A30 30 weeks gestation of pregnancy: Secondary | ICD-10-CM | POA: Insufficient documentation

## 2021-10-24 DIAGNOSIS — O26893 Other specified pregnancy related conditions, third trimester: Secondary | ICD-10-CM | POA: Diagnosis present

## 2021-10-24 DIAGNOSIS — R55 Syncope and collapse: Secondary | ICD-10-CM | POA: Diagnosis not present

## 2021-10-24 DIAGNOSIS — O99210 Obesity complicating pregnancy, unspecified trimester: Secondary | ICD-10-CM

## 2021-10-24 LAB — URINALYSIS, ROUTINE W REFLEX MICROSCOPIC
Bilirubin Urine: NEGATIVE
Glucose, UA: NEGATIVE mg/dL
Hgb urine dipstick: NEGATIVE
Ketones, ur: NEGATIVE mg/dL
Leukocytes,Ua: NEGATIVE
Nitrite: NEGATIVE
Protein, ur: 30 mg/dL — AB
Specific Gravity, Urine: 1.024 (ref 1.005–1.030)
pH: 6 (ref 5.0–8.0)

## 2021-10-24 LAB — GLUCOSE, CAPILLARY: Glucose-Capillary: 91 mg/dL (ref 70–99)

## 2021-10-24 LAB — COMPREHENSIVE METABOLIC PANEL
ALT: 15 U/L (ref 0–44)
AST: 15 U/L (ref 15–41)
Albumin: 3 g/dL — ABNORMAL LOW (ref 3.5–5.0)
Alkaline Phosphatase: 58 U/L (ref 38–126)
Anion gap: 7 (ref 5–15)
BUN: 7 mg/dL (ref 6–20)
CO2: 24 mmol/L (ref 22–32)
Calcium: 9 mg/dL (ref 8.9–10.3)
Chloride: 108 mmol/L (ref 98–111)
Creatinine, Ser: 0.68 mg/dL (ref 0.44–1.00)
GFR, Estimated: >60 mL/min (ref >60)
Glucose, Bld: 96 mg/dL (ref 70–99)
Potassium: 3.8 mmol/L (ref 3.5–5.1)
Sodium: 139 mmol/L (ref 135–145)
Total Bilirubin: 0.5 mg/dL (ref 0.3–1.2)
Total Protein: 6.1 g/dL — ABNORMAL LOW (ref 6.5–8.1)

## 2021-10-24 LAB — CBC
HCT: 33.8 % — ABNORMAL LOW (ref 36.0–46.0)
Hemoglobin: 11.9 g/dL — ABNORMAL LOW (ref 12.0–15.0)
MCH: 32.5 pg (ref 26.0–34.0)
MCHC: 35.2 g/dL (ref 30.0–36.0)
MCV: 92.3 fL (ref 80.0–100.0)
Platelets: 165 10*3/uL (ref 150–400)
RBC: 3.66 MIL/uL — ABNORMAL LOW (ref 3.87–5.11)
RDW: 12.9 % (ref 11.5–15.5)
WBC: 8.9 10*3/uL (ref 4.0–10.5)
nRBC: 0 % (ref 0.0–0.2)

## 2021-10-24 NOTE — MAU Provider Note (Signed)
History     CSN: [id number]  Arrival date and time: 10/24/21 1308  Chief Complaint  Patient presents with   Loss of Consciousness    Nichole Finley is a 21 yo female G2P1001 at [redacted] weeks gestation presenting via EMS for evaluation after a witnessed syncopal episode at work.   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 She was working today when she started feeling hot and lightheadedness with subsequent syncopal episode. Her co-worked caught her before she fell and did not hit her head or abdomen. She has not eaten anything tonight, reports she hasn't had much of an appetite with later pregnancy. Reports drinking about a cup of water and soda today.   This is the second occurrence, similar episode occurred on 6/23 and was seen in the ED afterwards. Potassium was low, given IV and oral replacement.   She felt well in between episodes and feels at her baseline on arrival to the MAU. No lightheadedness, dizziness, numbness, or extremity weakness. Denies any recent fever, sick contacts, abdominal pain, SOB, chest pain, LOF, vaginal bleeding, ctx, or headaches. Reports that her heart felt like it was racing with the vagal symptoms above but denies any other time. No known personal or family history of arrhythmias.    Her grandmother is on face time and is extremely worried about her having these episodes. Reports she had passed out several years ago. Would like answers.    Past Medical History:  Diagnosis Date   Depression    Vaginal Pap smear, abnormal     Past Surgical History:  Procedure Laterality Date   NO PAST SURGERIES      Family History  Problem Relation Age of Onset   Heart disease Maternal Grandfather    Heart disease Maternal Uncle     Social History   Tobacco Use   Smoking status: Former    Types: Cigarettes    Quit date: 05/23/2017    Years since quitting: 4.4   Smokeless tobacco: Never   Tobacco comments:     before pregnancy  Vaping Use   Vaping Use: Former   Devices: stopped when pregnancy confirmed  Substance Use Topics   Alcohol use: No   Drug use: No    Allergies: No Known Allergies  Medications Prior to Admission  Medication Sig Dispense Refill Last Dose   budesonide (RHINOCORT AQUA) 32 MCG/ACT nasal spray Place 1 spray into both nostrils in the morning and at bedtime. 5 mL 0    cetirizine (ZYRTEC ALLERGY) 10 MG tablet Take 1 tablet (10 mg total) by mouth daily. 30 tablet 0    ondansetron (ZOFRAN-ODT) 8 MG disintegrating tablet Take 1 tablet (8 mg total) by mouth every 8 (eight) hours as needed for nausea or vomiting. (Patient not taking: Reported on 10/14/2021) 20 tablet 0    potassium chloride SA (KLOR-CON M) 20 MEQ tablet Take 1 tablet (20 mEq total) by mouth daily. 3 tablet 0    Prenat-Fe Poly-Methfol-FA-DHA (VITAFOL ULTRA) 29-0.6-0.4-200 MG CAPS Take 1 tablet by mouth daily. 30 capsule 12     Review of Systems  Constitutional:  Negative for fatigue and fever.  Respiratory:  Negative for chest tightness and shortness of breath.   Cardiovascular:  Positive for palpitations. Negative for chest pain.  Gastrointestinal:  Negative for abdominal pain, constipation, diarrhea, nausea and vomiting.  Genitourinary:  Negative for vaginal bleeding.  Skin:  Negative for rash.  Neurological:  Positive for  syncope. Negative for dizziness, tremors, seizures, weakness, light-headedness, numbness and headaches.  Psychiatric/Behavioral:  Negative for behavioral problems.    Physical Exam   Blood pressure 115/63, pulse 88, temperature 98.4 F (36.9 C), resp. rate 16, height 5\' 8"  (1.727 m), weight 90.7 kg, last menstrual period 04/04/2021, SpO2 100 %.  Physical Exam Constitutional:      General: She is not in acute distress.    Appearance: Normal appearance. She is not ill-appearing, toxic-appearing or diaphoretic.  HENT:     Head: Normocephalic and atraumatic.     Mouth/Throat:     Mouth:  Mucous membranes are moist.  Eyes:     Extraocular Movements: Extraocular movements intact.  Cardiovascular:     Rate and Rhythm: Normal rate and regular rhythm.     Pulses: Normal pulses.     Heart sounds:     No friction rub. No gallop.     Comments: 2/6 systolic flow murmur heard in left/right 2nd intercostal space  Pulmonary:     Effort: Pulmonary effort is normal.     Breath sounds: Normal breath sounds.  Abdominal:     Palpations: Abdomen is soft.     Comments: Gravid appropriate for gestational age.   Musculoskeletal:     Cervical back: Normal range of motion.  Skin:    Capillary Refill: Capillary refill takes less than 2 seconds.  Neurological:     General: No focal deficit present.     Mental Status: She is alert and oriented to person, place, and time.     Gait: Gait normal.  Psychiatric:        Mood and Affect: Mood normal.        Behavior: Behavior normal.    NST: Appropriate/reactive for gestational age.  Baseline 140 bpm  Moderate variability  10x10  One mild variable with none > 45 minutes after monitoring afterwards. Reviewed with Dr. 14/08/2020.   MAU Course   MDM UA: No signs of infection. SG 1.024.  CBC, CMP POC glucose: 91  EKG: Normal sinus rhythm and similar to previous with some pre-excitation. Last 3 beats in V5/V6/V1 appear abnormal without any specific pattern. Attempted to repeat EKG in case this was artifact- however could get not get two different EKG machines to work anymore after about 5 attempts on each.  Given water/cranberry juice, crackers, and a food tray.   Reassessed: Patient doing well. Feeling at baseline. Discussed that labs are reassuring. No hypokalemia (like ED visit a few days). FHT reassuring/Cat I. Feeling good fetal movement. Discussed findings with her grandmother over Facetime.   Assessment and Plan   1. Vasovagal syncope Twice this week, in the setting of prolonged standing at work without any food/minimal drink today.  ?Palpitations prior to episode, but does sound more c/w prodromal vasovagal. EKG overall reassuring (abnormality at the end of leads, ?artifact motion). Placed ob cardiology referral for further evaluation, considerations for echo/zio patch given continued patient/family concern. Provided work note for sitting at a stool and frequent breaks. Encouraged frequent meals, increased hydration, and rocking legs/compression stockings when standing for long periods.   2. [redacted] weeks gestation of pregnancy NST reassuring/appropriate for gestational age. Vigorous fetal movement.    Discharged home in stable condition. MAU return precautions discussed. Reports ob fu next week.    Adrian Blackwater 10/24/2021, 1:15 PM

## 2021-10-31 ENCOUNTER — Other Ambulatory Visit: Payer: Medicaid Other

## 2021-10-31 DIAGNOSIS — Z3A31 31 weeks gestation of pregnancy: Secondary | ICD-10-CM

## 2021-11-01 LAB — CBC
Hematocrit: 35.2 % (ref 34.0–46.6)
Hemoglobin: 12.4 g/dL (ref 11.1–15.9)
MCH: 31.4 pg (ref 26.6–33.0)
MCHC: 35.2 g/dL (ref 31.5–35.7)
MCV: 89 fL (ref 79–97)
Platelets: 175 10*3/uL (ref 150–450)
RBC: 3.95 x10E6/uL (ref 3.77–5.28)
RDW: 11.8 % (ref 11.7–15.4)
WBC: 8.1 10*3/uL (ref 3.4–10.8)

## 2021-11-01 LAB — GLUCOSE TOLERANCE, 2 HOURS W/ 1HR
Glucose, 1 hour: 115 mg/dL (ref 70–179)
Glucose, 2 hour: 122 mg/dL (ref 70–152)
Glucose, Fasting: 83 mg/dL (ref 70–91)

## 2021-11-01 LAB — HIV ANTIBODY (ROUTINE TESTING W REFLEX): HIV Screen 4th Generation wRfx: NONREACTIVE

## 2021-11-01 LAB — RPR: RPR Ser Ql: NONREACTIVE

## 2021-11-02 LAB — CULTURE, OB URINE

## 2021-11-02 LAB — URINE CULTURE, OB REFLEX: Organism ID, Bacteria: NO GROWTH

## 2021-11-07 ENCOUNTER — Ambulatory Visit (INDEPENDENT_AMBULATORY_CARE_PROVIDER_SITE_OTHER): Payer: Medicaid Other | Admitting: Obstetrics & Gynecology

## 2021-11-07 VITALS — BP 112/73 | HR 105 | Wt 196.0 lb

## 2021-11-07 DIAGNOSIS — Z148 Genetic carrier of other disease: Secondary | ICD-10-CM

## 2021-11-07 DIAGNOSIS — Z3A32 32 weeks gestation of pregnancy: Secondary | ICD-10-CM

## 2021-11-07 DIAGNOSIS — Z3483 Encounter for supervision of other normal pregnancy, third trimester: Secondary | ICD-10-CM

## 2021-11-07 NOTE — Progress Notes (Unsigned)
   PRENATAL VISIT NOTE  Subjective:  Nichole Finley is a 21 y.o. G2P1001 at [redacted]w[redacted]d being seen today for ongoing prenatal care.  She is currently monitored for the following issues for this low-risk pregnancy and has MDD (major depressive disorder), single episode, moderate (HCC); Attention deficit hyperactivity disorder (ADHD), combined type, severe; Oppositional defiant disorder; Severe single current episode of major depressive disorder, without psychotic features (HCC); False positive HIV serology; Supervision of normal pregnancy; Maternal obesity affecting pregnancy, antepartum; and Carrier of spinal muscular atrophy on their problem list.  Patient reports still needs f/u for syncope.  Contractions: Not present. Vag. Bleeding: None.  Movement: Present. Denies leaking of fluid.   The following portions of the patient's history were reviewed and updated as appropriate: allergies, current medications, past family history, past medical history, past social history, past surgical history and problem list.   Objective:   Vitals:   11/07/21 1107  BP: 112/73  Pulse: (!) 105  Weight: 196 lb (88.9 kg)    Fetal Status: Fetal Heart Rate (bpm): 140   Movement: Present     General:  Alert, oriented and cooperative. Patient is in no acute distress.  Skin: Skin is warm and dry. No rash noted.   Cardiovascular: Normal heart rate noted  Respiratory: Normal respiratory effort, no problems with respiration noted  Abdomen: Soft, gravid, appropriate for gestational age.  Pain/Pressure: Absent     Pelvic: Cervical exam deferred        Extremities: Normal range of motion.  Edema: Trace  Mental Status: Normal mood and affect. Normal behavior. Normal judgment and thought content.   Assessment and Plan:  Pregnancy: G2P1001 at [redacted]w[redacted]d 1. Encounter for supervision of other normal pregnancy in third trimester   2. Carrier of spinal muscular atrophy Needs cards appointment  Preterm labor symptoms and general  obstetric precautions including but not limited to vaginal bleeding, contractions, leaking of fluid and fetal movement were reviewed in detail with the patient. Please refer to After Visit Summary for other counseling recommendations.   Return in about 2 weeks (around 11/21/2021).  Future Appointments  Date Time Provider Department Center  11/21/2021 10:35 AM Leftwich-Kirby, Wilmer Floor, CNM [facility] None  12/05/2021 10:35 AM Hermina Staggers, MD [facility] None  12/12/2021 10:35 AM Leftwich-Kirby, Wilmer Floor, CNM [facility] None  12/19/2021 10:35 AM Constant, Gigi Gin, MD [facility] None  12/26/2021 10:35 AM Leftwich-Kirby, Wilmer Floor, CNM [facility] None    Scheryl Darter, MD

## 2021-11-18 ENCOUNTER — Telehealth: Payer: Self-pay | Admitting: Emergency Medicine

## 2021-11-18 NOTE — Telephone Encounter (Signed)
Attempted TC to patient who left voicemail stating that she is having cramping. Unable to contact patient. LVM stating that patient should go to hospital if she is having persistent uterine cramping.

## 2021-11-21 ENCOUNTER — Ambulatory Visit (INDEPENDENT_AMBULATORY_CARE_PROVIDER_SITE_OTHER): Payer: Medicaid Other | Admitting: Advanced Practice Midwife

## 2021-11-21 VITALS — BP 125/72 | HR 92 | Wt 199.4 lb

## 2021-11-21 DIAGNOSIS — Z3A34 34 weeks gestation of pregnancy: Secondary | ICD-10-CM

## 2021-11-21 DIAGNOSIS — R55 Syncope and collapse: Secondary | ICD-10-CM

## 2021-11-21 DIAGNOSIS — O2342 Unspecified infection of urinary tract in pregnancy, second trimester: Secondary | ICD-10-CM

## 2021-11-21 DIAGNOSIS — Z3483 Encounter for supervision of other normal pregnancy, third trimester: Secondary | ICD-10-CM

## 2021-11-21 DIAGNOSIS — O26843 Uterine size-date discrepancy, third trimester: Secondary | ICD-10-CM

## 2021-11-21 NOTE — Progress Notes (Signed)
   PRENATAL VISIT NOTE  Subjective:  Nichole Finley is a 21 y.o. G2P1001 at [redacted]w[redacted]d being seen today for ongoing prenatal care.  She is currently monitored for the following issues for this low-risk pregnancy and has MDD (major depressive disorder), single episode, moderate (HCC); Attention deficit hyperactivity disorder (ADHD), combined type, severe; Oppositional defiant disorder; Severe single current episode of major depressive disorder, without psychotic features (HCC); False positive HIV serology; Supervision of normal pregnancy; Maternal obesity affecting pregnancy, antepartum; and Carrier of spinal muscular atrophy on their problem list.  Patient reports occasional contractions.  Contractions: Irritability. Vag. Bleeding: None.  Movement: Present. Denies leaking of fluid.   The following portions of the patient's history were reviewed and updated as appropriate: allergies, current medications, past family history, past medical history, past social history, past surgical history and problem list.   Objective:   Vitals:   11/21/21 1111  BP: 125/72  Pulse: 92  Weight: 199 lb 6.4 oz (90.4 kg)    Fetal Status: Fetal Heart Rate (bpm): 145 Fundal Height: 32 cm Movement: Present     General:  Alert, oriented and cooperative. Patient is in no acute distress.  Skin: Skin is warm and dry. No rash noted.   Cardiovascular: Normal heart rate noted  Respiratory: Normal respiratory effort, no problems with respiration noted  Abdomen: Soft, gravid, appropriate for gestational age.  Pain/Pressure: Absent     Pelvic: Cervical exam deferred        Extremities: Normal range of motion.  Edema: Trace  Mental Status: Normal mood and affect. Normal behavior. Normal judgment and thought content.   Assessment and Plan:  Pregnancy: G2P1001 at [redacted]w[redacted]d 1. Encounter for supervision of other normal pregnancy in third trimester --Anticipatory guidance about next visits/weeks of pregnancy given.   2. Vasovagal  syncope --No recent episodes, cardiology f/u  3. UTI (urinary tract infection) during pregnancy, second trimester --TOC negative  4. [redacted] weeks gestation of pregnancy   5. Uterine size date discrepancy pregnancy, third trimester --FH today 31.5-32 at 34 weeks.  Third trimester [place] ordered for EFW/AFI. - [place] MFM OB FOLLOW UP; Future   Preterm labor symptoms and general obstetric precautions including but not limited to vaginal bleeding, contractions, leaking of fluid and fetal movement were reviewed in detail with the patient. Please refer to After Visit Summary for other counseling recommendations.   Return in about 2 weeks (around 12/05/2021) for Plus schedule MFM follow up US.  Future Appointments  Date Time Provider Department Center  11/25/2021  2:00 PM Meriam Sprague, MD [facility] None  12/05/2021 10:35 AM Hermina Staggers, MD [facility] None  12/12/2021 10:35 AM Courtney Paris, Wilmer Floor, CNM [facility] None  12/14/2021  3:15 PM [facility] NURSE [facility]  12/14/2021  3:30 PM [facility] US3 [facility]  12/19/2021 10:35 AM Constant, Gigi Gin, MD [facility] None  12/26/2021 10:35 AM Leftwich-Kirby, Wilmer Floor, CNM [facility] None    Sharen Counter, CNM

## 2021-11-21 NOTE — Progress Notes (Signed)
Patient presents for ROB. Patient has no concerns today. Patient has appt with cardiologist on 7/28.

## 2021-11-24 NOTE — Progress Notes (Deleted)
Cardio-Obstetrics Clinic  New Evaluation  Date:  11/24/2021   ID:  Domenic Schwab, DOB [birth year], MRN [id number]  PCP:  Pcp, No   [facility] Providers Cardiologist:  None  Electrophysiologist:  None     Referring MD: Allayne Stack, DO   Chief Complaint: syncope  History of Present Illness:    Nichole Finley is a 21 y.o. female [G2P1001] who is being seen today for the evaluation of syncope at the request of Annia Friendly, Samantha N, DO.   Patient seen in the MAU on 10/24/21 after episode of syncope. Note reviewed. She was working at [employer] where she was standing for a prolonged period of time and began to feel hot and lightheaded with subsequent syncopal event. Had not eaten or drank much that day. Had similar episode on 6/23 and was seen in ED where K+ low. She was given IV and oral replacement.   Today, ***   Prior CV Studies Reviewed: The following studies were reviewed today: ***  Past Medical History:  Diagnosis Date   Depression    Vaginal Pap smear, abnormal     Past Surgical History:  Procedure Laterality Date   NO PAST SURGERIES     { Click here to update PMH, PSH, OB Hx then refresh note  :1}   OB History     Gravida  2   Para  1   Term  1   Preterm      AB      Living  1      SAB      IAB      Ectopic      Multiple      Live Births  1           { Click here to update OB Charting then refresh note  :1}    Current Medications: No outpatient medications have been marked as taking for the 11/25/21 encounter (Appointment) with Meriam Sprague, MD.     Allergies:   Patient has no known allergies.   Social History   Socioeconomic History   Marital status: Single    Spouse name: Not on file   Number of children: Not on file   Years of education: Not on file   Highest education level: Not on file  Occupational History   Not on file  Tobacco Use   Smoking status: Former    Types: Cigarettes    Quit date: 05/23/2017     Years since quitting: 4.5   Smokeless tobacco: Never   Tobacco comments:    before pregnancy  Vaping Use   Vaping Use: Former   Devices: stopped when pregnancy confirmed  Substance and Sexual Activity   Alcohol use: No   Drug use: No   Sexual activity: Yes    Partners: Male    Birth control/protection: None  Other Topics Concern   Not on file  Social History Narrative   Not on file   Social Determinants of Health   Financial Resource Strain: Not on file  Food Insecurity: Not on file  Transportation Needs: Not on file  Physical Activity: Not on file  Stress: Not on file  Social Connections: Not on file  { Click here to update SDOH then refresh :1}    Family History  Problem Relation Age of Onset   Heart disease Maternal Grandfather    Heart disease Maternal Uncle    { Click here to update FH then refresh note    :  1}   ROS:   Please see the history of present illness.    *** All other systems reviewed and are negative.   Labs/EKG Reviewed:    EKG:   EKG is *** ordered today.  The ekg ordered today demonstrates ***  Recent Labs: 10/24/2021: ALT 15; BUN 7; Creatinine, Ser 0.68; Potassium 3.8; Sodium 139 10/31/2021: Hemoglobin 12.4; Platelets 175   Recent Lipid Panel Lab Results  Component Value Date/Time   CHOL 130 10/07/2014 06:45 AM   TRIG 52 10/07/2014 06:45 AM   HDL 42 10/07/2014 06:45 AM   CHOLHDL 3.1 10/07/2014 06:45 AM   LDLCALC 78 10/07/2014 06:45 AM    Physical Exam:    VS:  LMP 04/04/2021 (Approximate)     Wt Readings from Last 3 Encounters:  11/21/21 199 lb 6.4 oz (90.4 kg)  11/07/21 196 lb (88.9 kg)  10/24/21 199 lb 15.3 oz (90.7 kg)     GEN: *** Well nourished, well developed in no acute distress HEENT: Normal NECK: No JVD; No carotid bruits LYMPHATICS: No lymphadenopathy CARDIAC: ***RRR, no murmurs, rubs, gallops RESPIRATORY:  Clear to auscultation without rales, wheezing or rhonchi  ABDOMEN: Soft, non-tender,  non-distended MUSCULOSKELETAL:  No edema; No deformity  SKIN: Warm and dry NEUROLOGIC:  Alert and oriented x 3 PSYCHIATRIC:  Normal affect    Risk Assessment/Risk Calculators:   { Click to calculate CARPREG II - THEN refresh note :1}    { Click to caclulate Mod WHO Class of CV Risk - THEN refresh note :1}     { Click for CHADS2VASc Score - THEN Refresh Note    :[id number]}      ASSESSMENT & PLAN:    #Syncope: Likely mixed orthostatic and vasovagal syncope which are common in pregnancy and exacerbated by venous pooling in the setting of a gravid uterus. Discussed importance of hydration, compression sock therapy, slow position changes, and increase PO intake to help prevent symptoms. Will also check zio monitor to ensure no arrhythmogenic etiology however, this is very unlikely. -Check zio monitor -Increase hydration -Small, frequent meals -Compression socks -Avoid triggers and ensure she takes breaks and puts feet up  There are no Patient Instructions on file for this visit.   Dispo:  No follow-ups on file.   Medication Adjustments/Labs and Tests Ordered: Current medicines are reviewed at length with the patient today.  Concerns regarding medicines are outlined above.  Tests Ordered: No orders of the defined types were placed in this encounter.  Medication Changes: No orders of the defined types were placed in this encounter.

## 2021-11-25 ENCOUNTER — Ambulatory Visit: Payer: Medicaid Other | Admitting: Cardiology

## 2021-11-28 ENCOUNTER — Encounter: Payer: Self-pay | Admitting: Obstetrics

## 2021-12-05 ENCOUNTER — Encounter: Payer: Self-pay | Admitting: Obstetrics and Gynecology

## 2021-12-05 ENCOUNTER — Other Ambulatory Visit (HOSPITAL_COMMUNITY)
Admission: RE | Admit: 2021-12-05 | Discharge: 2021-12-05 | Disposition: A | Discharge status: Home / Self Care | Payer: Medicaid Other | Source: Ambulatory Visit | Attending: Obstetrics and Gynecology | Admitting: Obstetrics and Gynecology

## 2021-12-05 ENCOUNTER — Ambulatory Visit (INDEPENDENT_AMBULATORY_CARE_PROVIDER_SITE_OTHER): Payer: Medicaid Other | Admitting: Obstetrics and Gynecology

## 2021-12-05 DIAGNOSIS — Z3483 Encounter for supervision of other normal pregnancy, third trimester: Secondary | ICD-10-CM | POA: Insufficient documentation

## 2021-12-05 NOTE — Patient Instructions (Signed)

## 2021-12-05 NOTE — Progress Notes (Signed)
Subjective:  Nichole Finley is a 21 y.o. G2P1001 at [redacted]w[redacted]d being seen today for ongoing prenatal care.  She is currently monitored for the following issues for this low-risk pregnancy and has MDD (major depressive disorder), single episode, moderate (HCC); Attention deficit hyperactivity disorder (ADHD), combined type, severe; Oppositional defiant disorder; Severe single current episode of major depressive disorder, without psychotic features (HCC); False positive HIV serology; Supervision of normal pregnancy; Maternal obesity affecting pregnancy, antepartum; and Carrier of spinal muscular atrophy on their problem list.  Patient reports general discomforts of pregnancy.  Contractions: Irritability. Vag. Bleeding: None.  Movement: Present. Denies leaking of fluid.   The following portions of the patient's history were reviewed and updated as appropriate: allergies, current medications, past family history, past medical history, past social history, past surgical history and problem list. Problem list updated.  Objective:   Vitals:   12/05/21 1036  BP: 120/69  Pulse: 74  Weight: 204 lb (92.5 kg)    Fetal Status: Fetal Heart Rate (bpm): 145   Movement: Present     General:  Alert, oriented and cooperative. Patient is in no acute distress.  Skin: Skin is warm and dry. No rash noted.   Cardiovascular: Normal heart rate noted  Respiratory: Normal respiratory effort, no problems with respiration noted  Abdomen: Soft, gravid, appropriate for gestational age. Pain/Pressure: Present     Pelvic:  Cervical exam performed        Extremities: Normal range of motion.  Edema: Trace  Mental Status: Normal mood and affect. Normal behavior. Normal judgment and thought content.   Urinalysis:      Assessment and Plan:  Pregnancy: G2P1001 at [redacted]w[redacted]d  1. Encounter for supervision of other normal pregnancy in third trimester Stable Growth scan next week - Culture, beta strep (group b only) - Cervicovaginal  ancillary only( [place])  Term labor symptoms and general obstetric precautions including but not limited to vaginal bleeding, contractions, leaking of fluid and fetal movement were reviewed in detail with the patient. Please refer to After Visit Summary for other counseling recommendations.  Return in about 1 week (around 12/12/2021) for OB visit, face to face, any provider.   Hermina Staggers, MD

## 2021-12-05 NOTE — Progress Notes (Signed)
ROB GBS 

## 2021-12-06 LAB — CERVICOVAGINAL ANCILLARY ONLY
Bacterial Vaginitis (gardnerella): POSITIVE — AB
Candida Glabrata: NEGATIVE
Candida Vaginitis: POSITIVE — AB
Chlamydia: NEGATIVE
Comment: NEGATIVE
Comment: NEGATIVE
Comment: NEGATIVE
Comment: NEGATIVE
Comment: NEGATIVE
Comment: NORMAL
Neisseria Gonorrhea: NEGATIVE
Trichomonas: NEGATIVE

## 2021-12-07 ENCOUNTER — Other Ambulatory Visit: Payer: Self-pay | Admitting: *Deleted

## 2021-12-07 MED ORDER — METRONIDAZOLE 500 MG PO TABS: 500.0000 mg | ORAL_TABLET | Freq: Two times a day (BID) | ORAL | 0 refills | Status: DC

## 2021-12-07 MED ORDER — TERCONAZOLE 0.4 % VA CREA: 1.0000 | TOPICAL_CREAM | Freq: Every day | VAGINAL | 0 refills | Status: DC

## 2021-12-07 NOTE — Progress Notes (Signed)
Flagyl and Terazol sent for +BV/yeast See lab result

## 2021-12-09 LAB — CULTURE, BETA STREP (GROUP B ONLY): Strep Gp B Culture: NEGATIVE

## 2021-12-12 ENCOUNTER — Ambulatory Visit (INDEPENDENT_AMBULATORY_CARE_PROVIDER_SITE_OTHER): Payer: Medicaid Other | Admitting: Advanced Practice Midwife

## 2021-12-12 VITALS — BP 119/69 | HR 88 | Wt 204.1 lb

## 2021-12-12 DIAGNOSIS — Z3A37 37 weeks gestation of pregnancy: Secondary | ICD-10-CM

## 2021-12-12 DIAGNOSIS — Z148 Genetic carrier of other disease: Secondary | ICD-10-CM

## 2021-12-12 DIAGNOSIS — Z3483 Encounter for supervision of other normal pregnancy, third trimester: Secondary | ICD-10-CM

## 2021-12-12 DIAGNOSIS — O99213 Obesity complicating pregnancy, third trimester: Secondary | ICD-10-CM

## 2021-12-12 DIAGNOSIS — O26843 Uterine size-date discrepancy, third trimester: Secondary | ICD-10-CM

## 2021-12-12 DIAGNOSIS — R55 Syncope and collapse: Secondary | ICD-10-CM

## 2021-12-12 DIAGNOSIS — O99210 Obesity complicating pregnancy, unspecified trimester: Secondary | ICD-10-CM

## 2021-12-12 NOTE — Patient Instructions (Signed)
Labor Precautions Reasons to come to MAU at [facility]:  1.  Contractions are  5 minutes apart or less, each last 1 minute, these have been going on for 1-2 hours, and you cannot walk or talk during them 2.  You have a large gush of fluid, or a trickle of fluid that will not stop and you have to wear a pad 3.  You have bleeding that is bright red, heavier than spotting--like menstrual bleeding (spotting can be normal in early labor or after a check of your cervix) 4.  You do not feel the baby moving like he/she normally does  

## 2021-12-12 NOTE — Progress Notes (Signed)
   PRENATAL VISIT NOTE  Subjective:  Nichole Finley is a 21 y.o. G2P1001 at [redacted]w[redacted]d being seen today for ongoing prenatal care.  She is currently monitored for the following issues for this low-risk pregnancy and has MDD (major depressive disorder), single episode, moderate (HCC); Attention deficit hyperactivity disorder (ADHD), combined type, severe; Oppositional defiant disorder; Severe single current episode of major depressive disorder, without psychotic features (HCC); False positive HIV serology; Supervision of normal pregnancy; Maternal obesity affecting pregnancy, antepartum; and Carrier of spinal muscular atrophy on their problem list.  Patient reports occasional contractions.  Contractions: Irritability. Vag. Bleeding: None.  Movement: Present. Denies leaking of fluid.   The following portions of the patient's history were reviewed and updated as appropriate: allergies, current medications, past family history, past medical history, past social history, past surgical history and problem list.   Objective:   Vitals:   12/12/21 1043  BP: 119/69  Pulse: 88  Weight: 204 lb 1.6 oz (92.6 kg)    Fetal Status: Fetal Heart Rate (bpm): 142 Fundal Height: 36 cm Movement: Present     General:  Alert, oriented and cooperative. Patient is in no acute distress.  Skin: Skin is warm and dry. No rash noted.   Cardiovascular: Normal heart rate noted  Respiratory: Normal respiratory effort, no problems with respiration noted  Abdomen: Soft, gravid, appropriate for gestational age.  Pain/Pressure: Absent     Pelvic: Cervical exam deferred        Extremities: Normal range of motion.  Edema: Trace  Mental Status: Normal mood and affect. Normal behavior. Normal judgment and thought content.   Assessment and Plan:  Pregnancy: G2P1001 at [redacted]w[redacted]d 1. Encounter for supervision of other normal pregnancy in third trimester --Anticipatory guidance about next visits/weeks of pregnancy given.   2. [redacted] weeks  gestation of pregnancy   3. Syncope, unspecified syncope type --Resolved, pt denies need to follow up with cardiology  4. Uterine size date discrepancy pregnancy, third trimester --FH 31-32 at 34 weeks, growth [place] on 8/16  5. Carrier of spinal muscular atrophy --partner did not complete testing  Preterm labor symptoms and general obstetric precautions including but not limited to vaginal bleeding, contractions, leaking of fluid and fetal movement were reviewed in detail with the patient. Please refer to After Visit Summary for other counseling recommendations.   Return in about 1 week (around 12/19/2021) for As scheduled.  Future Appointments  Date Time Provider Department Center  12/14/2021  3:15 PM [facility] NURSE [facility]  12/14/2021  3:30 PM [facility] US3 [facility]  12/19/2021 10:35 AM Constant, Gigi Gin, MD [facility] None  12/26/2021 10:35 AM Leftwich-Kirby, Wilmer Floor, CNM [facility] None    Sharen Counter, CNM

## 2021-12-14 ENCOUNTER — Ambulatory Visit: Payer: Medicaid Other | Attending: Advanced Practice Midwife

## 2021-12-14 ENCOUNTER — Ambulatory Visit: Payer: Medicaid Other | Admitting: *Deleted

## 2021-12-14 VITALS — BP 128/67 | HR 95

## 2021-12-14 DIAGNOSIS — Z148 Genetic carrier of other disease: Secondary | ICD-10-CM

## 2021-12-14 DIAGNOSIS — Z3483 Encounter for supervision of other normal pregnancy, third trimester: Secondary | ICD-10-CM | POA: Insufficient documentation

## 2021-12-14 DIAGNOSIS — Z3A37 37 weeks gestation of pregnancy: Secondary | ICD-10-CM | POA: Insufficient documentation

## 2021-12-14 DIAGNOSIS — O26843 Uterine size-date discrepancy, third trimester: Secondary | ICD-10-CM | POA: Insufficient documentation

## 2021-12-14 DIAGNOSIS — O99210 Obesity complicating pregnancy, unspecified trimester: Secondary | ICD-10-CM | POA: Insufficient documentation

## 2021-12-14 DIAGNOSIS — O285 Abnormal chromosomal and genetic finding on antenatal screening of mother: Secondary | ICD-10-CM

## 2021-12-16 ENCOUNTER — Encounter (HOSPITAL_COMMUNITY): Payer: Self-pay | Admitting: Obstetrics & Gynecology

## 2021-12-16 ENCOUNTER — Inpatient Hospital Stay (HOSPITAL_COMMUNITY)
Admission: AD | Admit: 2021-12-16 | Discharge: 2021-12-16 | Disposition: A | Discharge status: Home / Self Care | Payer: Medicaid Other | Attending: Obstetrics & Gynecology | Admitting: Obstetrics & Gynecology

## 2021-12-16 DIAGNOSIS — O471 False labor at or after 37 completed weeks of gestation: Secondary | ICD-10-CM | POA: Diagnosis present

## 2021-12-16 DIAGNOSIS — O479 False labor, unspecified: Secondary | ICD-10-CM | POA: Diagnosis not present

## 2021-12-16 DIAGNOSIS — Z3A37 37 weeks gestation of pregnancy: Secondary | ICD-10-CM | POA: Insufficient documentation

## 2021-12-16 DIAGNOSIS — O99210 Obesity complicating pregnancy, unspecified trimester: Secondary | ICD-10-CM

## 2021-12-16 DIAGNOSIS — Z3689 Encounter for other specified antenatal screening: Secondary | ICD-10-CM

## 2021-12-16 NOTE — MAU Note (Signed)
.  Nichole Finley is a 21 y.o. at [redacted]w[redacted]d here in MAU reporting: cramping since yesterday. Around 3:30 am started having lower back pain that radiates to bil lower abd. Denies bleeding or ROM. Reports positive fetal movement  Onset of complaint: last pm Pain score: back 4/10 5/10 lower abd Vitals:   12/16/21 1110  BP: 117/69  Pulse: 87  Resp: 16  Temp: 98 F (36.7 C)  SpO2: 98%     FHT:145 Lab orders placed from triage:

## 2021-12-16 NOTE — MAU Provider Note (Signed)
Patient was assessed for active labor and managed by nursing staff during this encounter. I have reviewed the chart and agree with the documentation and plan. I have also reviewed the NST for appropriate reactivity.  Fetal Tracing: reactive Baseline: 130 Variability: moderate Accelerations: 15x15  Decelerations: none Toco: UI/cramping  Cervical exam (x2): Dilation: Fingertip Effacement (%): 50 Cervical Position: Posterior Station: -2 Presentation: Vertex Exam by:: weston,rn    Patient stable for discharge home with labor precautions.  Edd Arbour, CNM, MSN, IBCLC Certified Nurse Midwife, [facility] 12/16/21 at 1:13 PM

## 2021-12-17 ENCOUNTER — Ambulatory Visit (INDEPENDENT_AMBULATORY_CARE_PROVIDER_SITE_OTHER): Payer: Medicaid Other

## 2021-12-17 ENCOUNTER — Ambulatory Visit
Admission: RE | Admit: 2021-12-17 | Discharge: 2021-12-17 | Disposition: A | Discharge status: Home / Self Care | Payer: Medicaid Other | Source: Ambulatory Visit | Attending: Physician Assistant | Admitting: Physician Assistant

## 2021-12-17 VITALS — BP 105/51 | HR 77 | Temp 98.1°F | Resp 17

## 2021-12-17 DIAGNOSIS — M79671 Pain in right foot: Secondary | ICD-10-CM

## 2021-12-17 DIAGNOSIS — M25571 Pain in right ankle and joints of right foot: Secondary | ICD-10-CM

## 2021-12-17 NOTE — ED Provider Notes (Signed)
[facility]    CSN: [id number] Arrival date & time: 12/17/21  5284      History   Chief Complaint Chief Complaint  Patient presents with   Foot Injury    Entered by patient    HPI Kaye Luoma is a 21 y.o. female.   Pt currently [redacted] weeks pregnant.  Reports she tripped and rolled her ankle yesterday.  She has had swelling and tenderness to the lateral portion or her right foot.  She has tried nothing for the sx.  She reports pain with ambulation, but walking without problem.     Past Medical History:  Diagnosis Date   Depression    Vaginal Pap smear, abnormal     Patient Active Problem List   Diagnosis Date Noted   Carrier of spinal muscular atrophy 07/11/2021   Maternal obesity affecting pregnancy, antepartum 06/13/2021   Supervision of normal pregnancy 06/03/2021   False positive HIV serology 03/03/2017   Severe single current episode of major depressive disorder, without psychotic features (HCC)    MDD (major depressive disorder), single episode, moderate (HCC) 10/06/2014   Attention deficit hyperactivity disorder (ADHD), combined type, severe 10/06/2014   Oppositional defiant disorder 10/06/2014    Past Surgical History:  Procedure Laterality Date   NO PAST SURGERIES      OB History     Gravida  2   Para  1   Term  1   Preterm      AB      Living  1      SAB      IAB      Ectopic      Multiple      Live Births  1            Home Medications    Prior to Admission medications   Medication Sig Start Date End Date Taking? Authorizing Provider  Prenat-Fe Poly-Methfol-FA-DHA (VITAFOL ULTRA) 29-0.6-0.4-200 MG CAPS Take 1 tablet by mouth daily. 06/13/21   Constant, Peggy, MD  terconazole (TERAZOL 7) 0.4 % vaginal cream Place 1 applicator vaginally at bedtime. 12/07/21   Hermina Staggers, MD    Family History Family History  Problem Relation Age of Onset   Heart disease Maternal Uncle    Diabetes Maternal Grandmother     Heart disease Maternal Grandfather    Asthma Neg Hx    Birth defects Neg Hx    Cancer Neg Hx    Stroke Neg Hx     Social History Social History   Tobacco Use   Smoking status: Former    Types: Cigarettes    Quit date: 05/23/2017    Years since quitting: 4.5   Smokeless tobacco: Never   Tobacco comments:    before pregnancy  Vaping Use   Vaping Use: Former   Devices: stopped when pregnancy confirmed  Substance Use Topics   Alcohol use: No   Drug use: No     Allergies   Patient has no known allergies.   Review of Systems Review of Systems  Constitutional:  Negative for chills and fever.  HENT:  Negative for ear pain and sore throat.   Eyes:  Negative for pain and visual disturbance.  Respiratory:  Negative for cough and shortness of breath.   Cardiovascular:  Negative for chest pain and palpitations.  Gastrointestinal:  Negative for abdominal pain and vomiting.  Genitourinary:  Negative for dysuria and hematuria.  Musculoskeletal:  Negative for arthralgias and back pain.  Right foot pain  Skin:  Negative for color change and rash.  Neurological:  Negative for seizures and syncope.  All other systems reviewed and are negative.    Physical Exam Triage Vital Signs ED Triage Vitals  Enc Vitals Group     BP 12/17/21 0946 (!) 105/51     Pulse Rate 12/17/21 0946 77     Resp 12/17/21 0946 17     Temp 12/17/21 0946 98.1 F (36.7 C)     Temp Source 12/17/21 0946 Oral     SpO2 12/17/21 0946 98 %     Weight --      Height --      Head Circumference --      Peak Flow --      Pain Score 12/17/21 0950 8     Pain Loc --      Pain Edu? --      Excl. in GC? --    No data found.  Updated Vital Signs BP (!) 105/51 (BP Location: Left Arm)   Pulse 77   Temp 98.1 F (36.7 C) (Oral)   Resp 17   LMP 04/04/2021 (Approximate)   SpO2 98%   Visual Acuity Right Eye Distance:   Left Eye Distance:   Bilateral Distance:    Right Eye Near:   Left Eye Near:     Bilateral Near:     Physical Exam Vitals and nursing note reviewed.  Constitutional:      General: She is not in acute distress.    Appearance: She is well-developed.  HENT:     Head: Normocephalic and atraumatic.  Eyes:     Conjunctiva/sclera: Conjunctivae normal.  Cardiovascular:     Rate and Rhythm: Normal rate and regular rhythm.     Heart sounds: No murmur heard. Pulmonary:     Effort: Pulmonary effort is normal. No respiratory distress.     Breath sounds: Normal breath sounds.  Abdominal:     Palpations: Abdomen is soft.     Tenderness: There is no abdominal tenderness.  Musculoskeletal:        General: No swelling.     Cervical back: Neck supple.     Comments: Dorsal medial aspect of the right foot with swelling and mild bruising.  TTP over 4th and 5th metatarsals.   Skin:    General: Skin is warm and dry.     Capillary Refill: Capillary refill takes less than 2 seconds.  Neurological:     Mental Status: She is alert.  Psychiatric:        Mood and Affect: Mood normal.      UC Treatments / Results  Labs (all labs ordered are listed, but only abnormal results are displayed) Labs Reviewed - No data to display  EKG   Radiology DG Foot Complete Right  Result Date: 12/17/2021 CLINICAL DATA:  21 year old female status post fall with right foot pain and swelling. EXAM: RIGHT FOOT COMPLETE - 3+ VIEW COMPARISON:  Right ankle series 01/04/2010. FINDINGS: Skeletally mature now. Bone mineralization is within normal limits. Calcaneus intact. Joint spaces and alignment maintained. No acute osseous abnormality identified. Mild distal foot soft tissue swelling. IMPRESSION: No acute fracture or dislocation identified about the right foot. Electronically Signed   By: Odessa Fleming M.D.   On: 12/17/2021 10:15    Procedures Procedures (including critical care time)  Medications Ordered in UC Medications - No data to display  Initial Impression / Assessment and Plan / UC Course  I  have reviewed the triage vital signs and the nursing notes.  Pertinent labs & imaging results that were available during my care of the patient were reviewed by me and considered in my medical decision making (see chart for details).     Right foot pain following injury without fracture.  Supportive care discussed.  Return precautions discussed.  Final Clinical Impressions(s) / UC Diagnoses   Final diagnoses:  Pain in joint involving right ankle and foot     Discharge Instructions      Apply ice and elevate Can take Tylenol as needed    ED Prescriptions   None    PDMP not reviewed this encounter.   Ward, Tylene Fantasia, PA-C 12/17/21 1025

## 2021-12-17 NOTE — ED Triage Notes (Signed)
Pt presents with right foot injury after a fall yesterday; foot is swollen and slightly discolored.

## 2021-12-17 NOTE — Discharge Instructions (Signed)
Apply ice and elevate Can take Tylenol as needed

## 2021-12-19 ENCOUNTER — Ambulatory Visit (INDEPENDENT_AMBULATORY_CARE_PROVIDER_SITE_OTHER): Payer: Medicaid Other | Admitting: Obstetrics and Gynecology

## 2021-12-19 ENCOUNTER — Encounter: Payer: Self-pay | Admitting: Obstetrics and Gynecology

## 2021-12-19 VITALS — BP 114/67 | HR 80 | Wt 208.6 lb

## 2021-12-19 DIAGNOSIS — O99210 Obesity complicating pregnancy, unspecified trimester: Secondary | ICD-10-CM

## 2021-12-19 DIAGNOSIS — Z3483 Encounter for supervision of other normal pregnancy, third trimester: Secondary | ICD-10-CM

## 2021-12-19 NOTE — Progress Notes (Signed)
Pt reports fetal movement with some contractions. 

## 2021-12-19 NOTE — Progress Notes (Signed)
   PRENATAL VISIT NOTE  Subjective:  Nichole Finley is a 21 y.o. G2P1001 at [redacted]w[redacted]d being seen today for ongoing prenatal care.  She is currently monitored for the following issues for this low-risk pregnancy and has MDD (major depressive disorder), single episode, moderate (HCC); Attention deficit hyperactivity disorder (ADHD), combined type, severe; Oppositional defiant disorder; Severe single current episode of major depressive disorder, without psychotic features (HCC); False positive HIV serology; Supervision of normal pregnancy; Maternal obesity affecting pregnancy, antepartum; and Carrier of spinal muscular atrophy on their problem list.  Patient reports no complaints.  Contractions: Irregular. Vag. Bleeding: None.  Movement: Present. Denies leaking of fluid.   The following portions of the patient's history were reviewed and updated as appropriate: allergies, current medications, past family history, past medical history, past social history, past surgical history and problem list.   Objective:   Vitals:   12/19/21 1053  BP: 114/67  Pulse: 80  Weight: 208 lb 9.6 oz (94.6 kg)    Fetal Status: Fetal Heart Rate (bpm): 140 Fundal Height: 38 cm Movement: Present     General:  Alert, oriented and cooperative. Patient is in no acute distress.  Skin: Skin is warm and dry. No rash noted.   Cardiovascular: Normal heart rate noted  Respiratory: Normal respiratory effort, no problems with respiration noted  Abdomen: Soft, gravid, appropriate for gestational age.  Pain/Pressure: Absent     Pelvic: Cervical exam deferred        Extremities: Normal range of motion.  Edema: Trace  Mental Status: Normal mood and affect. Normal behavior. Normal judgment and thought content.   Assessment and Plan:  Pregnancy: G2P1001 at [redacted]w[redacted]d 1. Encounter for supervision of other normal pregnancy in third trimester Patient is doing well without complaints Patient desires IUD for contraception  2. Maternal obesity  affecting pregnancy, antepartum   Term labor symptoms and general obstetric precautions including but not limited to vaginal bleeding, contractions, leaking of fluid and fetal movement were reviewed in detail with the patient. Please refer to After Visit Summary for other counseling recommendations.   No follow-ups on file.  Future Appointments  Date Time Provider Department Center  12/26/2021 10:35 AM Leftwich-Kirby, Wilmer Floor, CNM [facility] None    Catalina Antigua, MD

## 2021-12-26 ENCOUNTER — Encounter: Payer: Self-pay | Admitting: Advanced Practice Midwife

## 2021-12-26 ENCOUNTER — Ambulatory Visit (INDEPENDENT_AMBULATORY_CARE_PROVIDER_SITE_OTHER): Payer: Medicaid Other | Admitting: Advanced Practice Midwife

## 2021-12-26 VITALS — BP 128/71 | HR 81 | Wt 207.8 lb

## 2021-12-26 DIAGNOSIS — O99213 Obesity complicating pregnancy, third trimester: Secondary | ICD-10-CM

## 2021-12-26 DIAGNOSIS — O99210 Obesity complicating pregnancy, unspecified trimester: Secondary | ICD-10-CM

## 2021-12-26 DIAGNOSIS — Z3A39 39 weeks gestation of pregnancy: Secondary | ICD-10-CM

## 2021-12-26 DIAGNOSIS — Z3483 Encounter for supervision of other normal pregnancy, third trimester: Secondary | ICD-10-CM

## 2021-12-26 NOTE — Progress Notes (Signed)
Pt presents for ROB visit. Pt requesting cervical check today. 

## 2021-12-26 NOTE — Progress Notes (Signed)
   PRENATAL VISIT NOTE  Subjective:  Nichole Finley is a 21 y.o. G2P1001 at [redacted]w[redacted]d being seen today for ongoing prenatal care.  She is currently monitored for the following issues for this low-risk pregnancy and has MDD (major depressive disorder), single episode, moderate (HCC); Attention deficit hyperactivity disorder (ADHD), combined type, severe; Oppositional defiant disorder; Severe single current episode of major depressive disorder, without psychotic features (HCC); False positive HIV serology; Supervision of normal pregnancy; Maternal obesity affecting pregnancy, antepartum; and Carrier of spinal muscular atrophy on their problem list.  Patient reports occasional contractions.  Contractions: Irregular. Vag. Bleeding: None.  Movement: Present. Denies leaking of fluid.   The following portions of the patient's history were reviewed and updated as appropriate: allergies, current medications, past family history, past medical history, past social history, past surgical history and problem list.   Objective:   Vitals:   12/26/21 1050  BP: 128/71  Pulse: 81  Weight: 207 lb 12.8 oz (94.3 kg)    Fetal Status: Fetal Heart Rate (bpm): 145 Fundal Height: 38 cm Movement: Present  Presentation: Vertex  General:  Alert, oriented and cooperative. Patient is in no acute distress.  Skin: Skin is warm and dry. No rash noted.   Cardiovascular: Normal heart rate noted  Respiratory: Normal respiratory effort, no problems with respiration noted  Abdomen: Soft, gravid, appropriate for gestational age.  Pain/Pressure: Present     Pelvic: Cervical exam performed in the presence of a chaperone Dilation: 1 Effacement (%): 50 Station: -2  Extremities: Normal range of motion.  Edema: Trace  Mental Status: Normal mood and affect. Normal behavior. Normal judgment and thought content.   Assessment and Plan:  Pregnancy: G2P1001 at 109w0d 1. Encounter for supervision of other normal pregnancy in third  trimester --Anticipatory guidance about next visits/weeks of pregnancy given.  --Membranes swept after discussion and with shared decision making --Pt to try [employer] at home today --Labor precautions reviewed  2. Maternal obesity affecting pregnancy, antepartum   3. [redacted] weeks gestation of pregnancy   Term labor symptoms and general obstetric precautions including but not limited to vaginal bleeding, contractions, leaking of fluid and fetal movement were reviewed in detail with the patient. Please refer to After Visit Summary for other counseling recommendations.   Return in about 1 week (around 01/02/2022) for LOB, Any provider.  No future appointments.  Sharen Counter, CNM

## 2021-12-26 NOTE — Patient Instructions (Signed)
Things to Try After 37 weeks to Encourage Labor/Get Ready for Labor:    Try the Miles Circuit at www.milescircuit.com daily to improve baby's position and encourage the onset of labor.  Walk a little and rest a little every day.  Change positions often.  Cervical Ripening: May try one or both Red Raspberry Leaf capsules or tea:  two 300mg or 400mg tablets with each meal, 2-3 times a day, or 1-3 cups of tea daily  Potential Side Effects Of Raspberry Leaf:  Most women do not experience any side effects from drinking raspberry leaf tea. However, nausea and loose stools are possible   Evening Primrose Oil capsules: take 1 capsule by mouth and place one capsule in the vagina every night.    Some of the potential side effects:  Upset stomach  Loose stools or diarrhea  Headaches  Nausea  Sex can also help the cervix ripen and encourage labor onset.    Labor Precautions Reasons to come to MAU at [facility]:  1.  Contractions are  5 minutes apart or less, each last 1 minute, these have been going on for 1-2 hours, and you cannot walk or talk during them 2.  You have a large gush of fluid, or a trickle of fluid that will not stop and you have to wear a pad 3.  You have bleeding that is bright red, heavier than spotting--like menstrual bleeding (spotting can be normal in early labor or after a check of your cervix) 4.  You do not feel the baby moving like he/she normally does  

## 2022-01-02 ENCOUNTER — Inpatient Hospital Stay (HOSPITAL_COMMUNITY): Admit: 2022-01-02 | Payer: Self-pay

## 2022-01-03 ENCOUNTER — Encounter (HOSPITAL_COMMUNITY): Payer: Self-pay

## 2022-01-03 ENCOUNTER — Telehealth (HOSPITAL_COMMUNITY): Payer: Self-pay | Admitting: *Deleted

## 2022-01-03 ENCOUNTER — Ambulatory Visit (INDEPENDENT_AMBULATORY_CARE_PROVIDER_SITE_OTHER): Payer: Medicaid Other | Admitting: Obstetrics & Gynecology

## 2022-01-03 VITALS — BP 118/68 | HR 76 | Wt 209.5 lb

## 2022-01-03 DIAGNOSIS — Z3A40 40 weeks gestation of pregnancy: Secondary | ICD-10-CM

## 2022-01-03 DIAGNOSIS — Z3483 Encounter for supervision of other normal pregnancy, third trimester: Secondary | ICD-10-CM

## 2022-01-03 NOTE — Telephone Encounter (Signed)
Preadmission screen  

## 2022-01-03 NOTE — Progress Notes (Signed)
   PRENATAL VISIT NOTE  Subjective:  Nichole Finley is a 21 y.o. G2P1001 at [redacted]w[redacted]d being seen today for ongoing prenatal care.  She is currently monitored for the following issues for this low-risk pregnancy and has MDD (major depressive disorder), single episode, moderate (HCC); Attention deficit hyperactivity disorder (ADHD), combined type, severe; Oppositional defiant disorder; Severe single current episode of major depressive disorder, without psychotic features (HCC); False positive HIV serology; Supervision of normal pregnancy; Maternal obesity affecting pregnancy, antepartum; and Carrier of spinal muscular atrophy on their problem list.  Patient reports occasional contractions.  Contractions: Irritability. Vag. Bleeding: None.  Movement: Present. Denies leaking of fluid.   The following portions of the patient's history were reviewed and updated as appropriate: allergies, current medications, past family history, past medical history, past social history, past surgical history and problem list.   Objective:   Vitals:   01/03/22 1445  BP: 118/68  Pulse: 76  Weight: 209 lb 8 oz (95 kg)    Fetal Status: Fetal Heart Rate (bpm): NST   Movement: Present  Presentation: Vertex  General:  Alert, oriented and cooperative. Patient is in no acute distress.  Skin: Skin is warm and dry. No rash noted.   Cardiovascular: Normal heart rate noted  Respiratory: Normal respiratory effort, no problems with respiration noted  Abdomen: Soft, gravid, appropriate for gestational age.  Pain/Pressure: Present     Pelvic: Cervical exam performed in the presence of a chaperone Dilation: 1.5 Effacement (%): 50 Station: -3  Extremities: Normal range of motion.  Edema: Trace  Mental Status: Normal mood and affect. Normal behavior. Normal judgment and thought content.   Assessment and Plan:  Pregnancy: G2P1001 at [redacted]w[redacted]d 1. Encounter for supervision of other normal pregnancy in third trimester NST reactive, patient  notes fetal movement,will schedule IOL 41 weeks  Term labor symptoms and general obstetric precautions including but not limited to vaginal bleeding, contractions, leaking of fluid and fetal movement were reviewed in detail with the patient. Please refer to After Visit Summary for other counseling recommendations.   Return if symptoms worsen or fail to improve, for postpartum.  No future appointments.  Scheryl Darter, MD

## 2022-01-03 NOTE — Progress Notes (Signed)
Pt reports fetal movement with some cramping. Pt reports feeling a little less fetal movement in the last few days.

## 2022-01-04 ENCOUNTER — Telehealth (HOSPITAL_COMMUNITY): Payer: Self-pay | Admitting: *Deleted

## 2022-01-04 NOTE — Telephone Encounter (Signed)
Preadmission screen  

## 2022-01-07 ENCOUNTER — Other Ambulatory Visit: Payer: Self-pay | Admitting: Obstetrics and Gynecology

## 2022-01-09 ENCOUNTER — Inpatient Hospital Stay (HOSPITAL_COMMUNITY): Payer: Medicaid Other | Admitting: Anesthesiology

## 2022-01-09 ENCOUNTER — Inpatient Hospital Stay (HOSPITAL_COMMUNITY): Payer: Medicaid Other

## 2022-01-09 ENCOUNTER — Other Ambulatory Visit: Payer: Self-pay

## 2022-01-09 ENCOUNTER — Encounter (HOSPITAL_COMMUNITY): Payer: Self-pay | Admitting: Family Medicine

## 2022-01-09 ENCOUNTER — Inpatient Hospital Stay (HOSPITAL_COMMUNITY)
Admission: AD | Admit: 2022-01-09 | Discharge: 2022-01-10 | DRG: 807 | Disposition: A | Discharge status: Home / Self Care | Payer: Medicaid Other | Attending: Family Medicine | Admitting: Family Medicine

## 2022-01-09 DIAGNOSIS — Z87891 Personal history of nicotine dependence: Secondary | ICD-10-CM

## 2022-01-09 DIAGNOSIS — Z3A41 41 weeks gestation of pregnancy: Secondary | ICD-10-CM

## 2022-01-09 DIAGNOSIS — O99214 Obesity complicating childbirth: Secondary | ICD-10-CM | POA: Diagnosis present

## 2022-01-09 DIAGNOSIS — Z30430 Encounter for insertion of intrauterine contraceptive device: Secondary | ICD-10-CM | POA: Diagnosis not present

## 2022-01-09 DIAGNOSIS — Z148 Genetic carrier of other disease: Secondary | ICD-10-CM | POA: Diagnosis not present

## 2022-01-09 DIAGNOSIS — O480 Post-term pregnancy: Secondary | ICD-10-CM

## 2022-01-09 LAB — CBC
HCT: 33.8 % — ABNORMAL LOW (ref 36.0–46.0)
Hemoglobin: 11.7 g/dL — ABNORMAL LOW (ref 12.0–15.0)
MCH: 30.7 pg (ref 26.0–34.0)
MCHC: 34.6 g/dL (ref 30.0–36.0)
MCV: 88.7 fL (ref 80.0–100.0)
Platelets: 184 10*3/uL (ref 150–400)
RBC: 3.81 MIL/uL — ABNORMAL LOW (ref 3.87–5.11)
RDW: 13 % (ref 11.5–15.5)
WBC: 8.4 10*3/uL (ref 4.0–10.5)
nRBC: 0 % (ref 0.0–0.2)

## 2022-01-09 LAB — TYPE AND SCREEN
ABO/RH(D): B POS
Antibody Screen: NEGATIVE

## 2022-01-09 LAB — RPR: RPR Ser Ql: NONREACTIVE

## 2022-01-09 MED ORDER — FENTANYL-BUPIVACAINE-NACL 0.5-0.125-0.9 MG/250ML-% EP SOLN
12.0000 mL/h | EPIDURAL | Status: DC | PRN
  Administered 2022-01-09: 12 mL/h via EPIDURAL
  Filled 2022-01-09: qty 250

## 2022-01-09 MED ORDER — DIPHENHYDRAMINE HCL 50 MG/ML IJ SOLN: 12.5000 mg | INTRAMUSCULAR | Status: DC | PRN

## 2022-01-09 MED ORDER — ACETAMINOPHEN 325 MG PO TABS
650.0000 mg | ORAL_TABLET | ORAL | Status: DC | PRN
  Administered 2022-01-10: 650 mg via ORAL
  Filled 2022-01-09: qty 2

## 2022-01-09 MED ORDER — PHENYLEPHRINE 80 MCG/ML (10ML) SYRINGE FOR IV PUSH (FOR BLOOD PRESSURE SUPPORT)
80.0000 ug | PREFILLED_SYRINGE | INTRAVENOUS | Status: DC | PRN
  Filled 2022-01-09: qty 10

## 2022-01-09 MED ORDER — MISOPROSTOL 50MCG HALF TABLET
50.0000 ug | ORAL_TABLET | ORAL | Status: DC | PRN
  Administered 2022-01-09: 50 ug via BUCCAL
  Filled 2022-01-09: qty 1

## 2022-01-09 MED ORDER — TERBUTALINE SULFATE 1 MG/ML IJ SOLN: 0.2500 mg | Freq: Once | INTRAMUSCULAR | Status: DC | PRN

## 2022-01-09 MED ORDER — OXYTOCIN BOLUS FROM INFUSION
333.0000 mL | Freq: Once | INTRAVENOUS | Status: AC
  Administered 2022-01-09: 333 mL via INTRAVENOUS

## 2022-01-09 MED ORDER — ONDANSETRON HCL 4 MG PO TABS: 4.0000 mg | ORAL_TABLET | ORAL | Status: DC | PRN

## 2022-01-09 MED ORDER — ONDANSETRON HCL 4 MG/2ML IJ SOLN: 4.0000 mg | Freq: Four times a day (QID) | INTRAMUSCULAR | Status: DC | PRN

## 2022-01-09 MED ORDER — SOD CITRATE-CITRIC ACID 500-334 MG/5ML PO SOLN: 30.0000 mL | ORAL | Status: DC | PRN

## 2022-01-09 MED ORDER — PHENYLEPHRINE 80 MCG/ML (10ML) SYRINGE FOR IV PUSH (FOR BLOOD PRESSURE SUPPORT): 80.0000 ug | PREFILLED_SYRINGE | INTRAVENOUS | Status: DC | PRN

## 2022-01-09 MED ORDER — LEVONORGESTREL 20 MCG/DAY IU IUD
1.0000 | INTRAUTERINE_SYSTEM | Freq: Once | INTRAUTERINE | Status: AC
  Administered 2022-01-09: 1 via INTRAUTERINE
  Filled 2022-01-09: qty 1

## 2022-01-09 MED ORDER — DIPHENHYDRAMINE HCL 25 MG PO CAPS: 25.0000 mg | ORAL_CAPSULE | Freq: Four times a day (QID) | ORAL | Status: DC | PRN

## 2022-01-09 MED ORDER — OXYCODONE-ACETAMINOPHEN 5-325 MG PO TABS: 2.0000 | ORAL_TABLET | ORAL | Status: DC | PRN

## 2022-01-09 MED ORDER — WITCH HAZEL-GLYCERIN EX PADS: 1.0000 | MEDICATED_PAD | CUTANEOUS | Status: DC | PRN

## 2022-01-09 MED ORDER — OXYCODONE-ACETAMINOPHEN 5-325 MG PO TABS: 1.0000 | ORAL_TABLET | ORAL | Status: DC | PRN

## 2022-01-09 MED ORDER — ACETAMINOPHEN 325 MG PO TABS: 650.0000 mg | ORAL_TABLET | ORAL | Status: DC | PRN

## 2022-01-09 MED ORDER — FLEET ENEMA 7-19 GM/118ML RE ENEM: 1.0000 | ENEMA | RECTAL | Status: DC | PRN

## 2022-01-09 MED ORDER — COCONUT OIL OIL: 1.0000 | TOPICAL_OIL | Status: DC | PRN

## 2022-01-09 MED ORDER — OXYTOCIN-SODIUM CHLORIDE 30-0.9 UT/500ML-% IV SOLN
1.0000 m[IU]/min | INTRAVENOUS | Status: DC
  Administered 2022-01-09: 2 m[IU]/min via INTRAVENOUS
  Filled 2022-01-09: qty 500

## 2022-01-09 MED ORDER — VITAFOL ULTRA 29-0.6-0.4-200 MG PO CAPS
1.0000 | ORAL_CAPSULE | Freq: Every day | ORAL | Status: DC
Start: 2022-01-10 — End: 2022-01-09

## 2022-01-09 MED ORDER — LIDOCAINE HCL (PF) 1 % IJ SOLN: 30.0000 mL | INTRAMUSCULAR | Status: DC | PRN

## 2022-01-09 MED ORDER — LACTATED RINGERS IV SOLN
500.0000 mL | INTRAVENOUS | Status: DC | PRN
  Administered 2022-01-09: 500 mL via INTRAVENOUS

## 2022-01-09 MED ORDER — PRENATAL MULTIVITAMIN CH
1.0000 | ORAL_TABLET | Freq: Every day | ORAL | Status: DC
  Administered 2022-01-10: 1 via ORAL
  Filled 2022-01-09: qty 1

## 2022-01-09 MED ORDER — FENTANYL CITRATE (PF) 100 MCG/2ML IJ SOLN
INTRAMUSCULAR | Status: AC
  Filled 2022-01-09: qty 2

## 2022-01-09 MED ORDER — BENZOCAINE-MENTHOL 20-0.5 % EX AERO: 1.0000 | INHALATION_SPRAY | CUTANEOUS | Status: DC | PRN

## 2022-01-09 MED ORDER — DIBUCAINE (PERIANAL) 1 % EX OINT: 1.0000 | TOPICAL_OINTMENT | CUTANEOUS | Status: DC | PRN

## 2022-01-09 MED ORDER — TETANUS-DIPHTH-ACELL PERTUSSIS 5-2.5-18.5 LF-MCG/0.5 IM SUSY: 0.5000 mL | PREFILLED_SYRINGE | Freq: Once | INTRAMUSCULAR | Status: DC

## 2022-01-09 MED ORDER — FENTANYL CITRATE (PF) 100 MCG/2ML IJ SOLN
100.0000 ug | Freq: Once | INTRAMUSCULAR | Status: AC
  Administered 2022-01-09: 100 ug via INTRAVENOUS

## 2022-01-09 MED ORDER — EPHEDRINE 5 MG/ML INJ: 10.0000 mg | INTRAVENOUS | Status: DC | PRN

## 2022-01-09 MED ORDER — SENNOSIDES-DOCUSATE SODIUM 8.6-50 MG PO TABS
2.0000 | ORAL_TABLET | ORAL | Status: DC
  Administered 2022-01-10: 2 via ORAL
  Filled 2022-01-09 (×2): qty 2

## 2022-01-09 MED ORDER — IBUPROFEN 600 MG PO TABS
600.0000 mg | ORAL_TABLET | Freq: Four times a day (QID) | ORAL | Status: DC
  Administered 2022-01-09 – 2022-01-10 (×3): 600 mg via ORAL
  Filled 2022-01-09 (×3): qty 1

## 2022-01-09 MED ORDER — LIDOCAINE HCL (PF) 1 % IJ SOLN
INTRAMUSCULAR | Status: DC | PRN
  Administered 2022-01-09 (×2): 4 mL via EPIDURAL

## 2022-01-09 MED ORDER — SIMETHICONE 80 MG PO CHEW: 80.0000 mg | CHEWABLE_TABLET | ORAL | Status: DC | PRN

## 2022-01-09 MED ORDER — OXYTOCIN-SODIUM CHLORIDE 30-0.9 UT/500ML-% IV SOLN: 2.5000 [IU]/h | INTRAVENOUS | Status: DC

## 2022-01-09 MED ORDER — LACTATED RINGERS IV SOLN: INTRAVENOUS | Status: DC

## 2022-01-09 MED ORDER — ONDANSETRON HCL 4 MG/2ML IJ SOLN: 4.0000 mg | INTRAMUSCULAR | Status: DC | PRN

## 2022-01-09 MED ORDER — LACTATED RINGERS IV SOLN
500.0000 mL | Freq: Once | INTRAVENOUS | Status: AC
  Administered 2022-01-09: 500 mL via INTRAVENOUS

## 2022-01-09 NOTE — Progress Notes (Signed)
Labor Progress Note Nichole Finley is a 21 y.o. G2P1001 at [redacted]w[redacted]d presented for IOL 2/2 postdates.   S: Nichole Finley is doing well. Received epidural.   O:  BP (!) 121/58   Pulse 76   Temp 98.6 F (37 C) (Oral)   Resp 16   Ht 5\' 7"  (1.702 m)   Wt 97.7 kg   LMP 04/04/2021 (Approximate)   SpO2 100%   BMI 33.74 kg/m  EFM: 130/moderate variability/(+) acels, decelerations absent.   CVE: Dilation: 7 Effacement (%): 80 Station: -1, -2 Presentation: Vertex Exam by:: Dr. [email or web address]   A&P: 21 y.o. G2P1001 [redacted]w[redacted]d admitted to L&D for IOL 2/2 post dates.  #Labor: Progressing well. AROM performed. No fluid appreciated.  #Pain: Epidural.  #FWB: Cat I #GBS negative   Nichole Finley [redacted]w[redacted]d, MD Resident Physician 2:22 PM

## 2022-01-09 NOTE — Procedures (Addendum)
LABOR COURSE Nichole Finley is a 21 y.o. female G2P1001 with IUP at [redacted]w[redacted]d presenting for IOL secondary to postdates. Induction started with Cytotec and FB. Progressed well. AROM.  IUPC placed and pitocin started.   Delivery Note Called to room and patient was complete and pushing. Head delivered OA to LOA. No nuchal cord present. Shoulder and body delivered in usual fashion. At  1912 a viable and healthy female was delivered via Vaginal, Spontaneous (Presentation:OA;LOA ).  Infant with spontaneous cry, placed on mother's abdomen, dried and stimulated. Cord clamped x 2 after 1-minute delay, and cut by sister of the MOB. Cord blood drawn. Placenta delivered spontaneously with gentle cord traction. Appeared intact. Fundus firm with minimal bleeding and no signs of infection. Fundus firm with massage and Pitocin. Labia, perineum, vagina, and cervix inspected with no lacerations appreciated.    APGAR:9,9; weight not yet recorded.  Cord: 3VC with the following complications: N/A  Anesthesia: Epidural Episiotomy: None Lacerations: None Est. Blood Loss (mL): 70ml  Mom to postpartum.  Baby to Couplet care / Skin to Skin.  Cashion, Cyndra Numbers, MD Resident Physician 7:26 PM     Post-Placental IUD Insertion Procedure Note   Patient also desired postpartum contraception via post-placental Mirena IUD insertion.   Patient identified, informed consent signed, signed copy in chart, time out was performed.     - IUD grasped between sterile gloved fingers. Fundus identified through abdominal wall using non-insertion hand. IUD inserted to fundus with bimanual technique. IUD carefully released at the fundus and insertion hand gently removed from vagina.   Strings trimmed to the level of the introitus. Patient tolerated procedure well.   Lot #: [id number] Expiration Date: 01/09/2029   Patient given post procedure instructions and IUD care card with expiration date.  Patient is asked to keep IUD strings tucked in  her vagina until her postpartum follow up visit in 4-6 weeks. Patient advised to abstain from sexual intercourse and pulling on strings before her follow-up visit. Patient verbalized an understanding of the plan of care and agrees.    Attestation of Supervision of Resident:  I confirm that I have verified the information documented in the resident's note and that I have also personally performed the history, physical exam and all medical decision making activities.  I have verified that all services and findings are accurately documented in this note; and I agree with management and plan as outlined in the documentation. I have also made any necessary editorial changes.   I was gowned and gloved for duration of the delivery. Post-placental Mirena IUD insertion completed by myself as noted in procedure note above.  Billey Co, MD Center for [facility], [facility] 01/09/2022 7:40 PM

## 2022-01-09 NOTE — Progress Notes (Signed)
Labor Progress Note Nichole Finley is a 21 y.o. G2P1001 at [redacted]w[redacted]d presented for IOL 2/2 postdates.   S: Nichole Finley is doing well. Still not really feeling contractions.   O:  BP (!) 100/47   Pulse 72   Temp 98.8 F (37.1 C) (Oral)   Resp 16   Ht 5\' 7"  (1.702 m)   Wt 97.7 kg   LMP 04/04/2021 (Approximate)   BMI 33.74 kg/m  EFM: 130/moderate variability/(+) acels, decelerations absent.   CVE: Dilation: 7 Effacement (%): 70 Station: -1, -2 Presentation: Vertex Exam by:: Dr. [email or web address]   A&P: 21 y.o. G2P1001 [redacted]w[redacted]d admitted to L&D for IOL 2/2 post dates.  #Labor: Progressing well. FB out. Cervical exam as above. Discussed AROM. Patient to get epidural prior to AROM.  #Pain: Planning epidural and family support.  #FWB: Cat I #GBS negative   Nichole Finley [redacted]w[redacted]d, MD Resident Physician 1:18 PM

## 2022-01-09 NOTE — Plan of Care (Signed)

## 2022-01-09 NOTE — Anesthesia Procedure Notes (Signed)
Epidural Patient location during procedure: OB Start time: 01/09/2022 1:28 PM End time: 01/09/2022 1:31 PM  Staffing Anesthesiologist: Kaylyn Layer, MD Performed: anesthesiologist   Preanesthetic Checklist Completed: patient identified, IV checked, risks and benefits discussed, monitors and equipment checked, pre-op evaluation and timeout performed  Epidural Patient position: sitting Prep: DuraPrep and site prepped and draped Patient monitoring: continuous pulse ox, blood pressure and heart rate Approach: midline Location: L3-L4 Injection technique: LOR air  Needle:  Needle type: Tuohy  Needle gauge: 17 G Needle length: 9 cm Needle insertion depth: 6 cm Catheter type: closed end flexible Catheter size: 19 Gauge Catheter at skin depth: 11 cm Test dose: negative and Other (1% lidocaine)  Assessment Events: blood not aspirated, injection not painful, no injection resistance, no paresthesia and negative IV test  Additional Notes Patient identified. Risks, benefits, and alternatives discussed with patient including but not limited to bleeding, infection, nerve damage, paralysis, failed block, incomplete pain control, headache, blood pressure changes, nausea, vomiting, reactions to medication, itching, and postpartum back pain. Confirmed with bedside nurse the patient's most recent platelet count. Confirmed with patient that they are not currently taking any anticoagulation, have any bleeding history, or any family history of bleeding disorders. Patient expressed understanding and wished to proceed. All questions were answered. Sterile technique was used throughout the entire procedure. Please see nursing notes for vital signs.   Crisp LOR on first pass. Test dose was given through epidural catheter and negative prior to continuing to dose epidural or start infusion. Warning signs of high block given to the patient including shortness of breath, tingling/numbness in hands, complete  motor block, or any concerning symptoms with instructions to call for help. Patient was given instructions on fall risk and not to get out of bed. All questions and concerns addressed with instructions to call with any issues or inadequate analgesia.  Reason for block:procedure for pain

## 2022-01-09 NOTE — Progress Notes (Signed)
Labor Progress Note Nichole Finley is a 21 y.o. G2P1001 at [redacted]w[redacted]d presented for IOL 2/2 postdates.   S: [name] is resting comfortably. She is starting to feel contractions.   O:  BP 105/72   Pulse 73   Temp 98.1 F (36.7 C) (Oral)   Resp 16   Ht 5\' 7"  (1.702 m)   Wt 97.7 kg   LMP 04/04/2021 (Approximate)   SpO2 100%   BMI 33.74 kg/m  EFM: 130/moderate variability/(+) acels, decelerations absent.   CVE: Dilation: 7 Effacement (%): 80 Station: -2 Presentation: Vertex Exam by:: Dr. [email or web address]   A&P: 21 y.o. G2P1001 [redacted]w[redacted]d admitted to L&D for IOL 2/2 post dates.  #Labor: Progressing well. IUPC placed. Will start pitocin 2x2 due to inadequate contractions.  #Pain: Epidural  #FWB: Cat I #GBS negative   Jamar Weatherall [redacted]w[redacted]d, MD Resident Physician 4:46 PM

## 2022-01-09 NOTE — Anesthesia Preprocedure Evaluation (Signed)
Anesthesia Evaluation  Patient identified by MRN, date of birth, ID band Patient awake    Reviewed: Allergy & Precautions, Patient's Chart, lab work & pertinent test results  History of Anesthesia Complications Negative for: history of anesthetic complications  Airway Mallampati: II  TM Distance: >3 FB Neck ROM: Full    Dental no notable dental hx.    Pulmonary former smoker,    Pulmonary exam normal        Cardiovascular negative cardio ROS Normal cardiovascular exam     Neuro/Psych negative neurological ROS  negative psych ROS   GI/Hepatic negative GI ROS, Neg liver ROS,   Endo/Other  negative endocrine ROS  Renal/GU negative Renal ROS  negative genitourinary   Musculoskeletal negative musculoskeletal ROS (+)   Abdominal   Peds  Hematology negative hematology ROS (+)   Anesthesia Other Findings Day of surgery medications reviewed with patient.  Reproductive/Obstetrics (+) Pregnancy                             Anesthesia Physical Anesthesia Plan  ASA: 2  Anesthesia Plan: Epidural   Post-op Pain Management:    Induction:   PONV Risk Score and Plan: Treatment may vary due to age or medical condition  Airway Management Planned: Natural Airway  Additional Equipment: Fetal Monitoring  Intra-op Plan:   Post-operative Plan:   Informed Consent: I have reviewed the patients History and Physical, chart, labs and discussed the procedure including the risks, benefits and alternatives for the proposed anesthesia with the patient or authorized representative who has indicated his/her understanding and acceptance.       Plan Discussed with:   Anesthesia Plan Comments:         Anesthesia Quick Evaluation  

## 2022-01-09 NOTE — Lactation Note (Signed)
This note was copied from a baby's chart. Lactation Consultation Note  Patient Name: Nichole Finley BHALP'F Date: 01/09/2022 Reason for consult: L&D Initial assessment;Term Age:23 hours Assisted baby to the breast. Baby not opening mouth wide. LC assisted baby in opening mouth. Stroking nipple to top lip and chin tug. Baby may suckle a time or 2 then tongue thrust nipple out of mouth.  Multiple attempts to latch. Baby not opening mouth.  Encouraged mom to hold baby STS.  Mom is Breast/formula feeding. Will f/u mom on MBU.  Maternal Data    Feeding    LATCH Score Latch: Repeated attempts needed to sustain latch, nipple held in mouth throughout feeding, stimulation needed to elicit sucking reflex.  Audible Swallowing: None  Type of Nipple: Everted at rest and after stimulation  Comfort (Breast/Nipple): Soft / non-tender  Hold (Positioning): Full assist, staff holds infant at breast  LATCH Score: 5   Lactation Tools Discussed/Used    Interventions Interventions: Adjust position;Assisted with latch;Support pillows;Skin to skin;Breast massage;Breast compression  Discharge    Consult Status Consult Status: Follow-up from L&D Date: 01/10/22 Follow-up type: In-patient    Charyl Dancer 01/09/2022, 9:02 PM

## 2022-01-09 NOTE — Progress Notes (Signed)
S: At bedside with Dr Sudie Bailey at [phone] after prolonged decel and pitocin turned off. SVE by Dr Sudie Bailey noted to be complete/+1. IUPC in placed showing tachysystole with contractions q2 min, starting to space out after pitocin turned off.  O: SVE 10/100/+1  FHT Previously showing variable decels with contractions and then prolonged decel resolved with position changes and stopping pitocin. Now returned to baseline 135 with moderate variability, + accels no decels. Overall reassuring   A/P: IOL As FHT reassuring, continue to labor down as patient is comfortable and not feeling urge to push. Plan to start pushing in 30 minutes. Anticipate vaginal delivery.  Burley Saver MD

## 2022-01-09 NOTE — H&P (Signed)
OBSTETRIC ADMISSION HISTORY AND PHYSICAL  Nichole Finley is a 21 y.o. female G2P1001 with IUP at [redacted]w[redacted]d by [redacted]w[redacted]d [place] not equal to LMP presenting for IOL secondary to postdates. She reports +FMs, No LOF, no VB, no blurry vision, headaches or peripheral edema, and RUQ pain.  She plans on breast feeding. She request ppIUD for birth control. She received her prenatal care at  [facility]    Dating: By [redacted]w[redacted]d U/S --->  Estimated Date of Delivery: 01/02/22  Sono:    @[redacted]w[redacted]d , CWD, normal anatomy, cephalic presentation, 2823g, EFW   Prenatal History/Complications: N/A  Past Medical History: Past Medical History:  Diagnosis Date   Depression    Vaginal Pap smear, abnormal     Past Surgical History: Past Surgical History:  Procedure Laterality Date   NO PAST SURGERIES      Obstetrical History: OB History     Gravida  2   Para  1   Term  1   Preterm      AB      Living  1      SAB      IAB      Ectopic      Multiple      Live Births  1           Social History Social History   Socioeconomic History   Marital status: Single    Spouse name: Not on file   Number of children: Not on file   Years of education: Not on file   Highest education level: Not on file  Occupational History   Not on file  Tobacco Use   Smoking status: Former    Types: Cigarettes    Quit date: 05/23/2017    Years since quitting: 4.6   Smokeless tobacco: Never   Tobacco comments:    before pregnancy  Vaping Use   Vaping Use: Former   Devices: stopped when pregnancy confirmed  Substance and Sexual Activity   Alcohol use: No   Drug use: No   Sexual activity: Yes    Partners: Male    Birth control/protection: None  Other Topics Concern   Not on file  Social History Narrative   Not on file   Social Determinants of Health   Financial Resource Strain: Not on file  Food Insecurity: Not on file  Transportation Needs: Not on file  Physical Activity: Not on file  Stress: Not on  file  Social Connections: Not on file    Family History: Family History  Problem Relation Age of Onset   Heart disease Maternal Uncle    Diabetes Maternal Grandmother    Heart disease Maternal Grandfather    Asthma Neg Hx    Birth defects Neg Hx    Cancer Neg Hx    Stroke Neg Hx     Allergies: No Known Allergies  Medications Prior to Admission  Medication Sig Dispense Refill Last Dose   Prenat-Fe Poly-Methfol-FA-DHA (VITAFOL ULTRA) 29-0.6-0.4-200 MG CAPS Take 1 tablet by mouth daily. 30 capsule 12    terconazole (TERAZOL 7) 0.4 % vaginal cream Place 1 applicator vaginally at bedtime. (Patient not taking: Reported on 12/19/2021) 45 g 0      Review of Systems   All systems reviewed and negative except as stated in HPI  Blood pressure 128/69, pulse 90, temperature 98.8 F (37.1 C), temperature source Oral, resp. rate 16, height 5\' 7"  (1.702 m), weight 97.7 kg, last menstrual period 04/04/2021. General appearance: alert, cooperative,  and appears stated age Lungs: clear to auscultation bilaterally Heart: regular rate and rhythm Abdomen: soft, non-tender; bowel sounds normal Pelvic: no abnormalities Extremities: Homans sign is negative, no sign of DVT DTR's intact Presentation: cephalic Fetal monitoringBaseline: 140 bpm, Variability: Good {> 6 bpm), Accelerations: Reactive, and Decelerations: Absent Uterine activityFrequency: Every 10 minutes     Prenatal labs: ABO, Rh: B/Positive/-- (02/13 1006) Antibody: Negative (02/13 1006) Rubella: 3.82 (02/13 1006) RPR: Non Reactive (07/03 1122)  HBsAg: Negative (02/13 1006)  HIV: Non Reactive (07/03 1122)  GBS: Negative/-- (08/07 1106)  1 hr Glucola 83 Genetic screening  NIPS low risk, AFP: neg, Horizon: 2 copies of SMA Anatomy US appears normal  Prenatal Transfer Tool  Maternal Diabetes: No Genetic Screening: Normal Maternal Ultrasounds/Referrals: Normal Fetal Ultrasounds or other Referrals:  None Maternal Substance  Abuse:  No Significant Maternal Medications:  None Significant Maternal Lab Results:  GBS negative Number of Prenatal Visits:greater than 3 verified prenatal visits Other Comments:  None  No results found for this or any previous visit (from the past 24 hour(s)).  Patient Active Problem List   Diagnosis Date Noted   Indication for care in labor or delivery 01/09/2022   Carrier of spinal muscular atrophy 07/11/2021   Maternal obesity affecting pregnancy, antepartum 06/13/2021   Supervision of normal pregnancy 06/03/2021   False positive HIV serology 03/03/2017   Severe single current episode of major depressive disorder, without psychotic features (HCC)    MDD (major depressive disorder), single episode, moderate (HCC) 10/06/2014   Attention deficit hyperactivity disorder (ADHD), combined type, severe 10/06/2014   Oppositional defiant disorder 10/06/2014    Assessment/Plan:  Nichole Finley is a 21 y.o. G2P1001 at [redacted]w[redacted]d here for IOL 2/2 post dates.   #Labor: Methods of IOL explained at length to patient. She agreed with plan of starting cytotec and FB as needed with transition to eventual pitocin.  #Pain: Family support, planning for eventual epidural. #FWB: Cat I tracing #ID: GBS (-) #MOF: Breast and Bottle #MOC: post-placental IUD #Circ:  Girl - N/A  Moody Bruins, MD  Resident Physician 01/09/2022, 7:04 AM

## 2022-01-09 NOTE — Discharge Summary (Signed)
Postpartum Discharge Summary  Date of Service updated ***     Patient Name: Nichole Finley DOB: December 30, 2000 MRN: [id number]  Date of admission: 01/09/2022 Delivery date:01/09/2022  Delivering provider: Apolonio Schneiders  Date of discharge: 01/09/2022  Admitting diagnosis: Indication for care in labor or delivery [O75.9] Intrauterine pregnancy: [redacted]w[redacted]d    Secondary diagnosis:  Active Problems:   Indication for care in labor or delivery  Additional problems: None    Discharge diagnosis: Term Pregnancy Delivered                                              Post partum procedures: post-placental IUD placement Augmentation: AROM, Pitocin, and Cytotec, IP Foley Complications: None  Hospital course: Induction of Labor With Vaginal Delivery   21y.o. yo G2P1001 at 438w0das admitted to the hospital 01/09/2022 for induction of labor.  Indication for induction: Postdates.  Patient had an uncomplicated labor course as follows: Membrane Rupture Time/Date: 2:08 PM ,01/09/2022   Delivery Method:Vaginal, Spontaneous  Episiotomy:   None Lacerations:    None Details of delivery can be found in separate delivery note. Post-placental Mirena IUD placed within 4 minutes of delivery of placenta.  Patient had a routine postpartum course. Patient is discharged home 01/09/22.  Newborn Data: Birth date:01/09/2022  Birth time:7:12 PM  Gender:Female  Living status:Living  Apgars:9 ,  Weight:   Magnesium Sulfate received: {Mag received:30440022} BMZ received: No Rhophylac:N/A MMR:N/A T-DaP:Given prenatally Flu: N/A Transfusion:{Transfusion received:30440034}  Physical exam  Vitals:   01/09/22 1652 01/09/22 1750 01/09/22 1801 01/09/22 1920  BP: 117/62 (!) 143/72 128/67 (!) 141/92  Pulse: 72 78 69 70  Resp:  16  16  Temp:      TempSrc:      SpO2:      Weight:      Height:       General: alert, cooperative, and no distress Lochia: appropriate Uterine Fundus: firm Incision: N/A DVT  Evaluation: {Exam; dvt:2111122} Labs: Lab Results  Component Value Date   WBC 8.4 01/09/2022   HGB 11.7 (L) 01/09/2022   HCT 33.8 (L) 01/09/2022   MCV 88.7 01/09/2022   PLT 184 01/09/2022      Latest Ref Rng & Units 10/24/2021    1:29 PM  CMP  Glucose 70 - 99 mg/dL 96   BUN 6 - 20 mg/dL 7   Creatinine 0.44 - 1.00 mg/dL 0.68   Sodium 135 - 145 mmol/L 139   Potassium 3.5 - 5.1 mmol/L 3.8   Chloride 98 - 111 mmol/L 108   CO2 22 - 32 mmol/L 24   Calcium 8.9 - 10.3 mg/dL 9.0   Total Protein 6.5 - 8.1 g/dL 6.1   Total Bilirubin 0.3 - 1.2 mg/dL 0.5   Alkaline Phos 38 - 126 U/L 58   AST 15 - 41 U/L 15   ALT 0 - 44 U/L 15    Edinburgh Score:    11/27/2017    2:10 PM  Edinburgh Postnatal Depression Scale Screening Tool  I have been able to laugh and see the funny side of things. 0  I have looked forward with enjoyment to things. 0  I have blamed myself unnecessarily when things went wrong. 0  I have been anxious or worried for no good reason. 0  I have felt scared or panicky for no good  reason. 0  Things have been getting on top of me. 1  I have been so unhappy that I have had difficulty sleeping. 0  I have felt sad or miserable. 2  I have been so unhappy that I have been crying. 2  The thought of harming myself has occurred to me. 0  Edinburgh Postnatal Depression Scale Total 5     After visit meds:  Allergies as of 01/09/2022   No Known Allergies   Med Rec must be completed prior to using this [facility]***        Discharge home in stable condition Infant Feeding: Bottle and Breast Infant Disposition:home with mother Discharge instruction: per After Visit Summary and Postpartum booklet. Activity: Advance as tolerated. Pelvic rest for 6 weeks.  Diet: routine diet Future Appointments:No future appointments. Follow up Visit:  [place] Follow up.   Specialty: Obstetrics and Gynecology Contact  information: [address], [place] [phone]                 Please schedule this patient for a In person postpartum visit in 6 weeks with the following provider: Any provider. Additional Postpartum F/U: None   Low risk pregnancy complicated by:  None Delivery mode:  Vaginal, Spontaneous  Anticipated Birth Control:  PP IUD placed Mirena  Message sent to schedulers by Dr Thompson Grayer on 01/09/22   01/09/2022 Lenoria Chime, MD

## 2022-01-10 MED ORDER — ACETAMINOPHEN 325 MG PO TABS: 650.0000 mg | ORAL_TABLET | ORAL | Status: DC | PRN

## 2022-01-10 MED ORDER — IBUPROFEN 600 MG PO TABS: 600.0000 mg | ORAL_TABLET | Freq: Four times a day (QID) | ORAL | 1 refills | Status: DC

## 2022-01-10 NOTE — Lactation Note (Signed)
This note was copied from a baby's chart. Lactation Consultation Note  Patient Name: Girl Alannis Hsia EFEOF'H Date: 01/10/2022 Reason for consult: Initial assessment Age:23 hours  P2, Baby cueing, sucking on tongue.   Assisted with latching off and on breast. Discussed basics and suggest mother call for help as needed.    Maternal Data Has patient been taught Hand Expression?: Yes Does the patient have breastfeeding experience prior to this delivery?: Yes How long did the patient breastfeed?: 5 mos.  Feeding Mother's Current Feeding Choice: Breast Milk and Formula  LATCH Score Latch: Repeated attempts needed to sustain latch, nipple held in mouth throughout feeding, stimulation needed to elicit sucking reflex.  Audible Swallowing: A few with stimulation  Type of Nipple: Everted at rest and after stimulation  Comfort (Breast/Nipple): Soft / non-tender  Hold (Positioning): Assistance needed to correctly position infant at breast and maintain latch.  LATCH Score: 7   Lactation Tools Discussed/Used    Interventions Interventions: Assisted with latch;Skin to skin;Education;LC Services brochure  Discharge    Consult Status Consult Status: Follow-up Date: 01/11/22 Follow-up type: In-patient    Dahlia Byes [facility] 01/10/2022, 9:05 AM

## 2022-01-10 NOTE — Progress Notes (Signed)
Post Partum Day 1 following SVD Subjective: Pt has no complaints. She is eating, urinating, passing gas ambulating without dizziness. She has not yet had a bowel movement.   Objective: Blood pressure (!) 110/53, pulse 72, temperature 98.1 F (36.7 C), temperature source Oral, resp. rate 16, height 5\' 7"  (1.702 m), weight 97.7 kg, last menstrual period 04/04/2021, SpO2 99 %, unknown if currently breastfeeding.  Physical Exam:  General: alert, cooperative, and no distress Lochia: appropriate Uterine Fundus: Couldn't palpate Incision: Not applicable DVT Evaluation: No evidence of DVT seen on physical exam.No ankle edema.  Heart RRR Lungs: CTA bilaterally   Recent Labs    01/09/22 0700  HGB 11.7*  HCT 33.8*    Assessment/Plan: Baby is breastfeeding well. Contraception: IUD was placed after delivery. Discharge home   LOS: 1 day   03/11/22, Student-PA 01/10/2022, 7:34 AM

## 2022-01-10 NOTE — Social Work (Signed)
CSW received consult for hx of Depression. CSW met with MOB to offer support and complete assessment.    CSW met with MOB at bedside and introduced CSW role. CSW observed MOB in bed. MOB support person was holding the infant and FOB was asleep at bedside. MOB gave CSW permission to share all information with FOB and her sister present. MOB presented calm and engaged with CSW during the visit. CSW inquired how MOB has felt since giving birth. MOB expressed "feeling good, much better." MOB stated the labor and delivery went well, " I pushed for five minutes, it went fast."  MOB expressed her emotions were "up and down" during the pregnancy. MOB acknowledged that she has a history of depression diagnosed in 2017 and treated symptoms in the past with medication Prozac which she no longer takes. MOB stated to cope, "I isolate myself from others" as it gives her time to herself. CSW acknowledged her efforts and offered additional coping skills. MOB reported that she sees [facility] counselor "Seth Bake" which she feels is helpful and will continue to see during the postpartum period. MOB reported she will reach out to [facility] counselor to schedule the appointment. CSW discussed PPD. MOB reported she did not have PPD. CSW provided education regarding the baby blues period vs. perinatal mood disorders, discussed treatment and gave resources for mental health follow up if concerns arise.  CSW recommended MOB complete a self-evaluation during the postpartum time period using the New Mom Checklist from [employer]partum [employer] and encouraged MOB to contact a medical professional if symptoms are noted at any time. MOB reported she feels comfortable reaching out to her doctor if she concerns. CSW assessed MOB for safety. MOB denied thoughts of harm to self and others.   MOB reported she has essential items to care for the infant including WIC/FS. CSW provided review of Sudden Infant Death Syndrome (SIDS) precautions. MOB has chosen [facility] for the infant's follow up care. CSW offered community support. MOB politely declined. CSW assessed MOB for needs. She reported no further need.   CSW identifies no further need for intervention and no barriers to discharge at this time.   Kathrin Greathouse, MSW, LCSW [facility] Work[facility]  [phone] 01/10/2022  1:44 PM

## 2022-01-10 NOTE — Anesthesia Postprocedure Evaluation (Signed)
Anesthesia Post Note  Patient: Presenter, broadcasting  Procedure(s) Performed: AN AD HOC LABOR EPIDURAL     Patient location during evaluation: Mother Baby Anesthesia Type: Epidural Level of consciousness: awake and alert Pain management: pain level controlled Vital Signs Assessment: post-procedure vital signs reviewed and stable Respiratory status: spontaneous breathing, nonlabored ventilation and respiratory function stable Cardiovascular status: stable Postop Assessment: no headache, no backache and epidural receding Anesthetic complications: no   No notable events documented.  Last Vitals:  Vitals:   01/10/22 0227 01/10/22 0539  BP: (!) 123/54 (!) 110/53  Pulse: 71 72  Resp: 16 16  Temp: 36.9 C 36.7 C  SpO2: 100% 99%    Last Pain:  Vitals:   01/10/22 0539  TempSrc: Oral  PainSc: 0-No pain   Pain Goal:                   Lenin Kuhnle

## 2022-01-16 ENCOUNTER — Telehealth (HOSPITAL_COMMUNITY): Payer: Self-pay | Admitting: *Deleted

## 2022-01-16 NOTE — Telephone Encounter (Signed)
/  Attempted hospital discharge follow-up phone /call. "Call cannot be completed as dialed." Erline Levine, RN, 01/16/22, [phone]

## 2022-02-28 ENCOUNTER — Ambulatory Visit: Payer: Medicaid Other | Admitting: Obstetrics and Gynecology

## 2022-12-18 ENCOUNTER — Ambulatory Visit
Admission: EM | Admit: 2022-12-18 | Discharge: 2022-12-18 | Disposition: A | Discharge status: Home / Self Care | Payer: Medicaid Other

## 2022-12-18 ENCOUNTER — Encounter: Payer: Self-pay | Admitting: Emergency Medicine

## 2022-12-18 DIAGNOSIS — S81812A Laceration without foreign body, left lower leg, initial encounter: Secondary | ICD-10-CM

## 2022-12-18 NOTE — Discharge Instructions (Signed)
Laceration appears to be healing well.  Monitor for any increased redness, swelling, pus and follow-up sooner if this occurs.

## 2022-12-18 NOTE — ED Triage Notes (Signed)
Pt presents with laceration on left thigh, states was cut by blades of chainsaw on Friday.

## 2022-12-18 NOTE — ED Provider Notes (Signed)
[facility]    CSN: [id number] Arrival date & time: 12/18/22  1603      History   Chief Complaint No chief complaint on file.   HPI Nichole Finley is a 22 y.o. female.   Patient presents with laceration to the left lower thigh that occurred about 4 to 5 days ago while at work.  Patient reports that she was walking past a chainsaw when it nicked her leg.  They applied antibiotic ointment at work and she has been washing daily with soap.  Reports that she was told to come here and be evaluated to return to work.  Denies any purulent drainage or any associated fever to the site.     Past Medical History:  Diagnosis Date   Depression    Vaginal Pap smear, abnormal     Patient Active Problem List   Diagnosis Date Noted   Indication for care in labor or delivery 01/09/2022   Carrier of spinal muscular atrophy 07/11/2021   Maternal obesity affecting pregnancy, antepartum 06/13/2021   Supervision of normal pregnancy 06/03/2021   False positive HIV serology 03/03/2017   Severe single current episode of major depressive disorder, without psychotic features (HCC)    MDD (major depressive disorder), single episode, moderate (HCC) 10/06/2014   Attention deficit hyperactivity disorder (ADHD), combined type, severe 10/06/2014   Oppositional defiant disorder 10/06/2014    Past Surgical History:  Procedure Laterality Date   NO PAST SURGERIES      OB History     Gravida  2   Para  2   Term  2   Preterm      AB      Living  2      SAB      IAB      Ectopic      Multiple  0   Live Births  2            Home Medications    Prior to Admission medications   Medication Sig Start Date End Date Taking? Authorizing Provider  acetaminophen (TYLENOL) 325 MG tablet Take 2 tablets (650 mg total) by mouth every 4 (four) hours as needed (for pain scale < 4). 01/10/22   Katrinka Blazing, [place], CNM  ibuprofen (ADVIL) 600 MG tablet Take 1 tablet (600 mg total) by mouth  every 6 (six) hours. 01/10/22   Katrinka Blazing, [place], CNM  Prenat-Fe Poly-Methfol-FA-DHA (VITAFOL ULTRA) 29-0.6-0.4-200 MG CAPS Take 1 tablet by mouth daily. 06/13/21   Constant, Peggy, MD    Family History Family History  Problem Relation Age of Onset   Heart disease Maternal Uncle    Diabetes Maternal Grandmother    Heart disease Maternal Grandfather    Asthma Neg Hx    Birth defects Neg Hx    Cancer Neg Hx    Stroke Neg Hx     Social History Social History   Tobacco Use   Smoking status: Former    Current packs/day: 0.00    Types: Cigarettes    Quit date: 05/23/2017    Years since quitting: 5.5   Smokeless tobacco: Never   Tobacco comments:    before pregnancy  Vaping Use   Vaping status: Former   Devices: stopped when pregnancy confirmed  Substance Use Topics   Alcohol use: No   Drug use: No     Allergies   Patient has no known allergies.   Review of Systems Review of Systems Per HPI  Physical Exam Triage Vital Signs  ED Triage Vitals  Encounter Vitals Group     BP 12/18/22 1627 115/67     Systolic BP Percentile --      Diastolic BP Percentile --      Pulse Rate 12/18/22 1627 73     Resp 12/18/22 1627 16     Temp 12/18/22 1627 98.5 F (36.9 C)     Temp Source 12/18/22 1627 Oral     SpO2 12/18/22 1627 98 %     Weight --      Height --      Head Circumference --      Peak Flow --      Pain Score 12/18/22 1626 6     Pain Loc --      Pain Education --      Exclude from Growth Chart --    No data found.  Updated Vital Signs BP 115/67 (BP Location: Left Arm)   Pulse 73   Temp 98.5 F (36.9 C) (Oral)   Resp 16   SpO2 98%   Visual Acuity Right Eye Distance:   Left Eye Distance:   Bilateral Distance:    Right Eye Near:   Left Eye Near:    Bilateral Near:     Physical Exam Constitutional:      General: She is not in acute distress.    Appearance: Normal appearance. She is not toxic-appearing or diaphoretic.  HENT:     Head: Normocephalic and  atraumatic.  Eyes:     Extraocular Movements: Extraocular movements intact.     Conjunctiva/sclera: Conjunctivae normal.  Pulmonary:     Effort: Pulmonary effort is normal.  Skin:    Comments: Has approximately 2 inch very superficial linear laceration present to left anterior thigh directly above knee.  No obvious swelling, discoloration, purulent drainage noted.  Appears to be healing well and is currently scabbed over.  Wound edges are closely approximated.  Neurological:     General: No focal deficit present.     Mental Status: She is alert and oriented to person, place, and time. Mental status is at baseline.  Psychiatric:        Mood and Affect: Mood normal.        Behavior: Behavior normal.        Thought Content: Thought content normal.        Judgment: Judgment normal.      UC Treatments / Results  Labs (all labs ordered are listed, but only abnormal results are displayed) Labs Reviewed - No data to display  EKG   Radiology No results found.  Procedures Procedures (including critical care time)  Medications Ordered in UC Medications - No data to display  Initial Impression / Assessment and Plan / UC Course  I have reviewed the triage vital signs and the nursing notes.  Pertinent labs & imaging results that were available during my care of the patient were reviewed by me and considered in my medical decision making (see chart for details).     Patient has healing laceration with no obvious complications or signs of infection present to left anterior thigh.  Wound edges are closely approximated and no closure is needed with sutures or tissue adhesive.  Advised to continue to monitor for routine healing and follow-up if signs of infection occur.  Tetanus vaccine was up-to-date in 2023.  Patient verbalized understanding and was agreeable with plan. Final Clinical Impressions(s) / UC Diagnoses   Final diagnoses:  Laceration of left lower extremity, initial  encounter     Discharge Instructions      Laceration appears to be healing well.  Monitor for any increased redness, swelling, pus and follow-up sooner if this occurs.    ED Prescriptions   None    PDMP not reviewed this encounter.   Gustavus Bryant, [place] 12/18/22 1640

## 2023-02-16 ENCOUNTER — Ambulatory Visit: Payer: No Typology Code available for payment source | Admitting: Nurse Practitioner

## 2023-03-05 ENCOUNTER — Ambulatory Visit: Payer: No Typology Code available for payment source | Admitting: Nurse Practitioner

## 2023-05-02 NOTE — L&D Delivery Note (Signed)
   Delivery Note:   [id number] at [redacted]w[redacted]d  Admitting diagnosis: Pregnancy [Z34.90] Risks:  Patient Active Problem List   Diagnosis Date Noted   Pregnancy 01/15/2024   Supervision of other normal pregnancy, antepartum 07/11/2023   Carrier of spinal muscular atrophy 07/11/2021   Maternal obesity affecting pregnancy, antepartum 06/13/2021   False positive HIV serology 03/03/2017   Severe single current episode of major depressive disorder, without psychotic features (HCC)    MDD (major depressive disorder), single episode, moderate (HCC) 10/06/2014   Attention deficit hyperactivity disorder (ADHD), combined type, severe 10/06/2014   Oppositional defiant disorder 10/06/2014     First Stage:  Induction of labor:N/A  Onset of labor: 01/15/24 2000 Augmentation: N/A ROM: SROM  Active labor onset: 2304 Analgesia /Anesthesia/Pain control intrapartum: None  Second Stage:  Complete dilation at 01/15/2024 2304 Onset of pushing at 2304 FHR second stage Cat II moderate variability. Variable decels with pushing but quick return to baseline. Patient pushed for 3 contractions.    Pushing in side lying and lithotomy position with CNM and L&D staff support. Family and Friends present for birth and supportive.   Delivery of a Live born female  Birth Weight:  PENDING  APGAR: 9, 9  Newborn Delivery   Birth date/time: 01/15/2024 23:16:00 Delivery type: Vaginal, Spontaneous    With great maternal pushing efforts fetal head delivered in cephalic presentation, position DOA and spontaneously restituted to LOT. With great maternal pushing efforts remaining fetal body delivered with ease and vigorously crying infant placed immediately skin to skin.  Nuchal Cord: NO   After several mins of life cord double clamped after cessation of pulsation, and cut by FOB.  Collection of cord blood for typing completed. Cord blood donation-None Arterial cord blood sample-No   Third Stage:  With gentle cord  traction and LUS massage Placenta delivered-Spontaneous intact with 3 vessels. Uterine tone firm bleeding minimal Uterotonics: IV pit bolus  Placenta to L&D for dispo .  None laceration identified.  Episiotomy:None Local analgesia: N/A   Repair:N/A  Est. Blood Loss (mL):105.00  Complications: None   Mom to postpartum.  Baby boy Larita to Couplet care / Skin to Skin.  Delivery Report:  Review the Delivery Report for details.    Oluwafemi Villella Erven) Emilio, MSN, CNM  [facility]  01/15/24 11:42 PM

## 2023-06-11 ENCOUNTER — Ambulatory Visit
Admission: EM | Admit: 2023-06-11 | Discharge: 2023-06-11 | Disposition: A | Discharge status: Home / Self Care | Payer: No Typology Code available for payment source | Attending: Physician Assistant | Admitting: Physician Assistant

## 2023-06-11 ENCOUNTER — Encounter: Payer: Self-pay | Admitting: Emergency Medicine

## 2023-06-11 DIAGNOSIS — Z3201 Encounter for pregnancy test, result positive: Secondary | ICD-10-CM | POA: Diagnosis not present

## 2023-06-11 DIAGNOSIS — N926 Irregular menstruation, unspecified: Secondary | ICD-10-CM | POA: Diagnosis not present

## 2023-06-11 LAB — POCT URINE PREGNANCY: Preg Test, Ur: POSITIVE — AB

## 2023-06-11 NOTE — ED Triage Notes (Signed)
 Pt reports positive pregnancy test at home last night and needed a confirmatory test on file for [facility] to start prenatal care.

## 2023-06-18 ENCOUNTER — Inpatient Hospital Stay (HOSPITAL_COMMUNITY)
Admission: AD | Admit: 2023-06-18 | Discharge: 2023-06-18 | Disposition: A | Discharge status: Home / Self Care | Payer: No Typology Code available for payment source | Attending: Obstetrics and Gynecology | Admitting: Obstetrics and Gynecology

## 2023-06-18 ENCOUNTER — Encounter (HOSPITAL_COMMUNITY): Payer: Self-pay | Admitting: *Deleted

## 2023-06-18 ENCOUNTER — Inpatient Hospital Stay (HOSPITAL_COMMUNITY): Payer: No Typology Code available for payment source

## 2023-06-18 ENCOUNTER — Other Ambulatory Visit: Payer: Self-pay

## 2023-06-18 DIAGNOSIS — O23591 Infection of other part of genital tract in pregnancy, first trimester: Secondary | ICD-10-CM | POA: Insufficient documentation

## 2023-06-18 DIAGNOSIS — O209 Hemorrhage in early pregnancy, unspecified: Secondary | ICD-10-CM | POA: Insufficient documentation

## 2023-06-18 DIAGNOSIS — O26891 Other specified pregnancy related conditions, first trimester: Secondary | ICD-10-CM | POA: Diagnosis present

## 2023-06-18 DIAGNOSIS — A488 Other specified bacterial diseases: Secondary | ICD-10-CM | POA: Insufficient documentation

## 2023-06-18 DIAGNOSIS — Z3A01 Less than 8 weeks gestation of pregnancy: Secondary | ICD-10-CM | POA: Diagnosis not present

## 2023-06-18 DIAGNOSIS — B9689 Other specified bacterial agents as the cause of diseases classified elsewhere: Secondary | ICD-10-CM

## 2023-06-18 DIAGNOSIS — R109 Unspecified abdominal pain: Secondary | ICD-10-CM | POA: Diagnosis present

## 2023-06-18 DIAGNOSIS — O26851 Spotting complicating pregnancy, first trimester: Secondary | ICD-10-CM | POA: Diagnosis present

## 2023-06-18 LAB — WET PREP, GENITAL
Sperm: NONE SEEN
Trich, Wet Prep: NONE SEEN
WBC, Wet Prep HPF POC: >=10 — AB (ref <10)
Yeast Wet Prep HPF POC: NONE SEEN

## 2023-06-18 LAB — CBC
HCT: 39.1 % (ref 36.0–46.0)
Hemoglobin: 13.5 g/dL (ref 12.0–15.0)
MCH: 30.8 pg (ref 26.0–34.0)
MCHC: 34.5 g/dL (ref 30.0–36.0)
MCV: 89.3 fL (ref 80.0–100.0)
Platelets: 199 10*3/uL (ref 150–400)
RBC: 4.38 MIL/uL (ref 3.87–5.11)
RDW: 12.4 % (ref 11.5–15.5)
WBC: 6.9 10*3/uL (ref 4.0–10.5)
nRBC: 0 % (ref 0.0–0.2)

## 2023-06-18 LAB — COMPREHENSIVE METABOLIC PANEL
ALT: 14 U/L (ref 0–44)
AST: 17 U/L (ref 15–41)
Albumin: 3.8 g/dL (ref 3.5–5.0)
Alkaline Phosphatase: 49 U/L (ref 38–126)
Anion gap: 9 (ref 5–15)
BUN: 9 mg/dL (ref 6–20)
CO2: 23 mmol/L (ref 22–32)
Calcium: 9.1 mg/dL (ref 8.9–10.3)
Chloride: 104 mmol/L (ref 98–111)
Creatinine, Ser: 0.77 mg/dL (ref 0.44–1.00)
GFR, Estimated: >60 mL/min (ref >60)
Glucose, Bld: 80 mg/dL (ref 70–99)
Potassium: 3.5 mmol/L (ref 3.5–5.1)
Sodium: 136 mmol/L (ref 135–145)
Total Bilirubin: 0.3 mg/dL (ref 0.0–1.2)
Total Protein: 6.6 g/dL (ref 6.5–8.1)

## 2023-06-18 LAB — URINALYSIS, ROUTINE W REFLEX MICROSCOPIC
Bilirubin Urine: NEGATIVE
Glucose, UA: NEGATIVE mg/dL
Hgb urine dipstick: NEGATIVE
Ketones, ur: NEGATIVE mg/dL
Nitrite: NEGATIVE
Protein, ur: NEGATIVE mg/dL
Specific Gravity, Urine: 1.026 (ref 1.005–1.030)
pH: 6 (ref 5.0–8.0)

## 2023-06-18 LAB — HCG, QUANTITATIVE, PREGNANCY: hCG, Beta Chain, Quant, S: 39303 m[IU]/mL — ABNORMAL HIGH (ref <5)

## 2023-06-18 MED ORDER — METRONIDAZOLE 0.75 % VA GEL: 1.0000 | Freq: Every day | VAGINAL | 0 refills | Status: AC

## 2023-06-18 NOTE — MAU Note (Signed)
Nichole Finley is a 23 y.o. at [redacted]w[redacted]d here in MAU reporting: she is having intermittent abdominal cramping that began a few days ago and had spotting yesterday (none today).  Denies recent intercourse  LMP: unsure (end of December beginning of January Onset of complaint: 3 days Pain score: 2 Vitals:   06/18/23 1905  BP: (!) 132/59  Pulse: 71  Resp: 19  Temp: 98.1 F (36.7 C)  SpO2: 100%     FHT: NA  Lab orders placed from triage: UA

## 2023-06-18 NOTE — ED Provider Notes (Signed)
[facility]    CSN: [id number] Arrival date & time: 06/11/23  1737      History   Chief Complaint Chief Complaint  Patient presents with   Possible Pregnancy    HPI Nichole Finley is a 23 y.o. female.   Patient here today for confirmation of pregnancy. She reports that she has taken tests at home that were positive and would like to have official testing in office for documentation.   The history is provided by the patient.  Possible Pregnancy Pertinent negatives include no abdominal pain.    Past Medical History:  Diagnosis Date   Depression    Vaginal Pap smear, abnormal     Patient Active Problem List   Diagnosis Date Noted   Indication for care in labor or delivery 01/09/2022   Carrier of spinal muscular atrophy 07/11/2021   Maternal obesity affecting pregnancy, antepartum 06/13/2021   Supervision of normal pregnancy 06/03/2021   False positive HIV serology 03/03/2017   Severe single current episode of major depressive disorder, without psychotic features (HCC)    MDD (major depressive disorder), single episode, moderate (HCC) 10/06/2014   Attention deficit hyperactivity disorder (ADHD), combined type, severe 10/06/2014   Oppositional defiant disorder 10/06/2014    Past Surgical History:  Procedure Laterality Date   NO PAST SURGERIES      OB History     Gravida  3   Para  2   Term  2   Preterm      AB      Living  2      SAB      IAB      Ectopic      Multiple  0   Live Births  2            Home Medications    Prior to Admission medications   Medication Sig Start Date End Date Taking? Authorizing Provider  acetaminophen (TYLENOL) 325 MG tablet Take 2 tablets (650 mg total) by mouth every 4 (four) hours as needed (for pain scale < 4). 01/10/22   Katrinka Blazing, [place], CNM  ibuprofen (ADVIL) 600 MG tablet Take 1 tablet (600 mg total) by mouth every 6 (six) hours. 01/10/22   Katrinka Blazing, [place], CNM  Prenat-Fe Poly-Methfol-FA-DHA  (VITAFOL ULTRA) 29-0.6-0.4-200 MG CAPS Take 1 tablet by mouth daily. 06/13/21   Constant, Peggy, MD    Family History Family History  Problem Relation Age of Onset   Heart disease Maternal Uncle    Diabetes Maternal Grandmother    Heart disease Maternal Grandfather    Asthma Neg Hx    Birth defects Neg Hx    Cancer Neg Hx    Stroke Neg Hx     Social History Social History   Tobacco Use   Smoking status: Former    Current packs/day: 0.00    Types: Cigarettes    Quit date: 05/23/2017    Years since quitting: 6.0   Smokeless tobacco: Never   Tobacco comments:    before pregnancy  Vaping Use   Vaping status: Former   Devices: stopped when pregnancy confirmed  Substance Use Topics   Alcohol use: No   Drug use: No     Allergies   Patient has no known allergies.   Review of Systems Review of Systems  Constitutional:  Negative for chills and fever.  Eyes:  Negative for discharge and redness.  Gastrointestinal:  Negative for abdominal pain, nausea and vomiting.  Genitourinary:  Positive for menstrual problem.  Negative for vaginal bleeding.     Physical Exam Triage Vital Signs ED Triage Vitals [06/11/23 1826]  Encounter Vitals Group     BP 123/73     Systolic BP Percentile      Diastolic BP Percentile      Pulse Rate 68     Resp 16     Temp 98 F (36.7 C)     Temp Source Oral     SpO2 99 %     Weight      Height      Head Circumference      Peak Flow      Pain Score 0     Pain Loc      Pain Education      Exclude from Growth Chart    No data found.  Updated Vital Signs BP 123/73 (BP Location: Left Arm)   Pulse 68   Temp 98 F (36.7 C) (Oral)   Resp 16   LMP 05/02/2023 (Approximate)   SpO2 99%   Breastfeeding No   Visual Acuity Right Eye Distance:   Left Eye Distance:   Bilateral Distance:    Right Eye Near:   Left Eye Near:    Bilateral Near:     Physical Exam Vitals and nursing note reviewed.  Constitutional:      General: She is not  in acute distress.    Appearance: Normal appearance. She is not ill-appearing.  HENT:     Head: Normocephalic and atraumatic.  Eyes:     Conjunctiva/sclera: Conjunctivae normal.  Cardiovascular:     Rate and Rhythm: Normal rate.  Pulmonary:     Effort: Pulmonary effort is normal. No respiratory distress.  Neurological:     Mental Status: She is alert.  Psychiatric:        Mood and Affect: Mood normal.        Behavior: Behavior normal.        Thought Content: Thought content normal.      UC Treatments / Results  Labs (all labs ordered are listed, but only abnormal results are displayed) Labs Reviewed  POCT URINE PREGNANCY - Abnormal; Notable for the following components:      Result Value   Preg Test, Ur Positive (*)    All other components within normal limits    EKG   Radiology No results found.  Procedures Procedures (including critical care time)  Medications Ordered in UC Medications - No data to display  Initial Impression / Assessment and Plan / UC Course  I have reviewed the triage vital signs and the nursing notes.  Pertinent labs & imaging results that were available during my care of the patient were reviewed by me and considered in my medical decision making (see chart for details).    Pregnancy test positive. Encouraged patient to follow up with OB as soon as possible or sooner with any concerns.   Final Clinical Impressions(s) / UC Diagnoses   Final diagnoses:  Pregnancy test positive  Missed period   Discharge Instructions   None    ED Prescriptions   None    PDMP not reviewed this encounter.   Tomi Bamberger, PA-C 06/18/23 1958

## 2023-06-18 NOTE — Discharge Instructions (Signed)
Your ultrasound shows a healthy pregnancy. We discussed that vaginal bleeding in the first trimester is common, and that 80-90% of patients will go on to have a normal pregnancy with a live delivery. The remainder are at increased risk for miscarriage, unfortunately there are no known interventions to mitigate this risk. We discussed return precautions including crescendo abdominal pain, heavy vaginal bleeding soaking >1 pad/hour, and fever.

## 2023-06-18 NOTE — MAU Provider Note (Signed)
History     [id number]  Arrival date and time: 06/18/23 1813    Chief Complaint  Patient presents with   Cramping   Spotting     HPI Nichole Finley is a 23 y.o. at [redacted]w[redacted]d, who presents for vaginal bleeding.   Patient reports started bleeding today, just spotting Having some very mild intermittent cramps No other symptoms No recent intercourse     OB History     Gravida  3   Para  2   Term  2   Preterm      AB      Living  2      SAB      IAB      Ectopic      Multiple  0   Live Births  2           Past Medical History:  Diagnosis Date   Depression    Vaginal Pap smear, abnormal     Past Surgical History:  Procedure Laterality Date   NO PAST SURGERIES      Family History  Problem Relation Age of Onset   Heart disease Maternal Uncle    Diabetes Maternal Grandmother    Heart disease Maternal Grandfather    Asthma Neg Hx    Birth defects Neg Hx    Cancer Neg Hx    Stroke Neg Hx     Social History   Socioeconomic History   Marital status: Single    Spouse name: Not on file   Number of children: Not on file   Years of education: Not on file   Highest education level: Not on file  Occupational History   Not on file  Tobacco Use   Smoking status: Former    Current packs/day: 0.00    Types: Cigarettes    Quit date: 05/23/2017    Years since quitting: 6.0   Smokeless tobacco: Never   Tobacco comments:    before pregnancy  Vaping Use   Vaping status: Former   Devices: stopped when pregnancy confirmed  Substance and Sexual Activity   Alcohol use: No   Drug use: No   Sexual activity: Yes    Partners: Male    Birth control/protection: None  Other Topics Concern   Not on file  Social History Narrative   Not on file   Social Drivers of Health   Financial Resource Strain: Not on File (08/18/2021)   Received from [employer], [employer]    Financial Resource Strain: 0  Food Insecurity: Not on File (01/25/2023)    Received from [facility] Insecurity    Food: 0  Transportation Needs: Not on File (08/18/2021)   Received from [employer], [employer] Needs    Transportation: 0  Physical Activity: Not on File (08/18/2021)   Received from [place], [place]   Physical Activity    Physical Activity: 0  Stress: Not on File (08/18/2021)   Received from [facility], [place]   Stress    Stress: 0  Social Connections: Not on File (01/13/2023)   Received from [employer]   Social Connections    Connectedness: 0  Intimate Partner Violence: Not on file    No Known Allergies  No current facility-administered medications on file prior to encounter.   Current Outpatient Medications on File Prior to Encounter  Medication Sig Dispense Refill   acetaminophen (TYLENOL) 325 MG tablet Take 2 tablets (650 mg total) by mouth every 4 (four)  hours as needed (for pain scale < 4).     ibuprofen (ADVIL) 600 MG tablet Take 1 tablet (600 mg total) by mouth every 6 (six) hours. 30 tablet 1   Prenat-Fe Poly-Methfol-FA-DHA (VITAFOL ULTRA) 29-0.6-0.4-200 MG CAPS Take 1 tablet by mouth daily. 30 capsule 12     ROS Pertinent positives and negative per HPI, all others reviewed and negative  Physical Exam   BP (!) 132/59 (BP Location: Right Arm)   Pulse 71   Temp 98.1 F (36.7 C) (Oral)   Resp 19   Ht 5\' 8"  (1.727 m)   Wt 86.6 kg   LMP 05/02/2023 (Approximate)   SpO2 100%   BMI 29.04 kg/m   Patient Vitals for the past 24 hrs:  BP Temp Temp src Pulse Resp SpO2 Height Weight  06/18/23 1905 (!) 132/59 98.1 F (36.7 C) Oral 71 19 100 % -- --  06/18/23 1901 -- -- -- -- -- -- 5\' 8"  (1.727 m) 86.6 kg    Physical Exam Vitals reviewed.  Constitutional:      General: She is not in acute distress.    Appearance: She is well-developed. She is not diaphoretic.  Eyes:     General: No scleral icterus. Pulmonary:     Effort: Pulmonary effort is normal. No respiratory distress.  Skin:    General: Skin is warm and dry.   Neurological:     Mental Status: She is alert.     Coordination: Coordination normal.      Cervical Exam    Bedside Ultrasound Not performed.  My interpretation: n/a   Labs Results for orders placed or performed during the hospital encounter of 06/18/23 (from the past 24 hours)  Urinalysis, Routine w reflex microscopic -Urine, Clean Catch     Status: Abnormal   Collection Time: 06/18/23  7:07 PM  Result Value Ref Range   Color, Urine YELLOW YELLOW   APPearance HAZY (A) CLEAR   Specific Gravity, Urine 1.026 1.005 - 1.030   pH 6.0 5.0 - 8.0   Glucose, UA NEGATIVE NEGATIVE mg/dL   Hgb urine dipstick NEGATIVE NEGATIVE   Bilirubin Urine NEGATIVE NEGATIVE   Ketones, ur NEGATIVE NEGATIVE mg/dL   Protein, ur NEGATIVE NEGATIVE mg/dL   Nitrite NEGATIVE NEGATIVE   Leukocytes,Ua SMALL (A) NEGATIVE   RBC / HPF 0-5 0 - 5 RBC/hpf   WBC, UA 0-5 0 - 5 WBC/hpf   Bacteria, UA RARE (A) NONE SEEN   Squamous Epithelial / HPF 6-10 0 - 5 /HPF   Mucus PRESENT   CBC     Status: None   Collection Time: 06/18/23  7:25 PM  Result Value Ref Range   WBC 6.9 4.0 - 10.5 K/uL   RBC 4.38 3.87 - 5.11 MIL/uL   Hemoglobin 13.5 12.0 - 15.0 g/dL   HCT 13.2 44.0 - 10.2 %   MCV 89.3 80.0 - 100.0 fL   MCH 30.8 26.0 - 34.0 pg   MCHC 34.5 30.0 - 36.0 g/dL   RDW 72.5 36.6 - 44.0 %   Platelets 199 150 - 400 K/uL   nRBC 0.0 0.0 - 0.2 %  Wet prep, genital     Status: Abnormal   Collection Time: 06/18/23  7:45 PM  Result Value Ref Range   Yeast Wet Prep HPF POC NONE SEEN NONE SEEN   Trich, Wet Prep NONE SEEN NONE SEEN   Clue Cells Wet Prep HPF POC PRESENT (A) NONE SEEN   WBC, Wet Prep HPF POC >=  10 (A) <10   Sperm NONE SEEN     Imaging No results found.  MAU Course  Procedures Lab Orders         Wet prep, genital         CBC         Comprehensive metabolic panel         hCG, quantitative, pregnancy         Urinalysis, Routine w reflex microscopic -Urine, Clean Catch    Meds ordered this  encounter  Medications   metroNIDAZOLE (METROGEL) 0.75 % vaginal gel    Sig: Place 1 Applicatorful vaginally at bedtime for 5 days.    Dispense:  50 g    Refill:  0   Imaging Orders         US OB LESS THAN 14 WEEKS WITH OB TRANSVAGINAL     MDM Moderate (Level 3-4)  Assessment and Plan  #Vaginal bleeding in pregnancy, first trimester #[redacted] weeks gestation of pregnancy US shows viable IUP. We discussed that vaginal bleeding in the first trimester is common, and that 80-90% of patients will go on to have a normal pregnancy with a live delivery. The remainder are at increased risk for miscarriage, unfortunately there are no known interventions to mitigate this risk. Blood type Rh positive, rhogam not indicated. We discussed return precautions including crescendo abdominal pain, heavy vaginal bleeding soaking >1 pad/hour, and fever.   #BV Rx sent for metrogel.   Dispo: discharged to home in stable condition. Has f/u appt already scheduled at [facility].     Venora Maples, MD/MPH 06/18/23 8:19 PM  Allergies as of 06/18/2023   No Known Allergies      Medication List     STOP taking these medications    ibuprofen 600 MG tablet Commonly known as: ADVIL       TAKE these medications    acetaminophen 325 MG tablet Commonly known as: Tylenol Take 2 tablets (650 mg total) by mouth every 4 (four) hours as needed (for pain scale < 4).   metroNIDAZOLE 0.75 % vaginal gel Commonly known as: METROGEL Place 1 Applicatorful vaginally at bedtime for 5 days.   Vitafol Ultra 29-0.6-0.4-200 MG Caps Take 1 tablet by mouth daily.

## 2023-06-19 LAB — GC/CHLAMYDIA PROBE AMP (~~LOC~~) NOT AT ARMC
Chlamydia: NEGATIVE
Comment: NEGATIVE
Comment: NORMAL
Neisseria Gonorrhea: NEGATIVE

## 2023-07-06 IMAGING — US US OB LIMITED
1 series · 9 of 9 positions shown · non-contrast
Comparison: none

[Series 1: us ob limited · 9 of 9 slices shown]
[im 1/9]
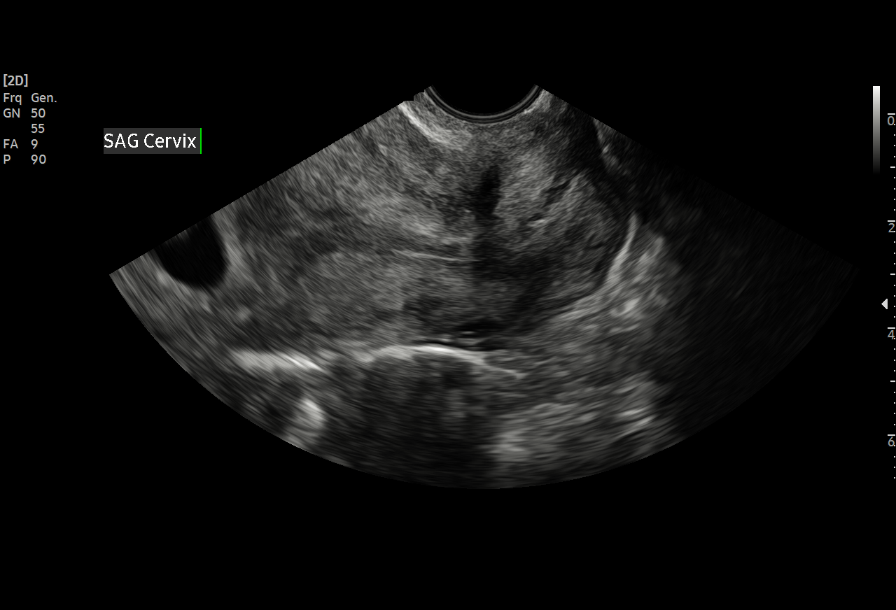
[im 2/9]
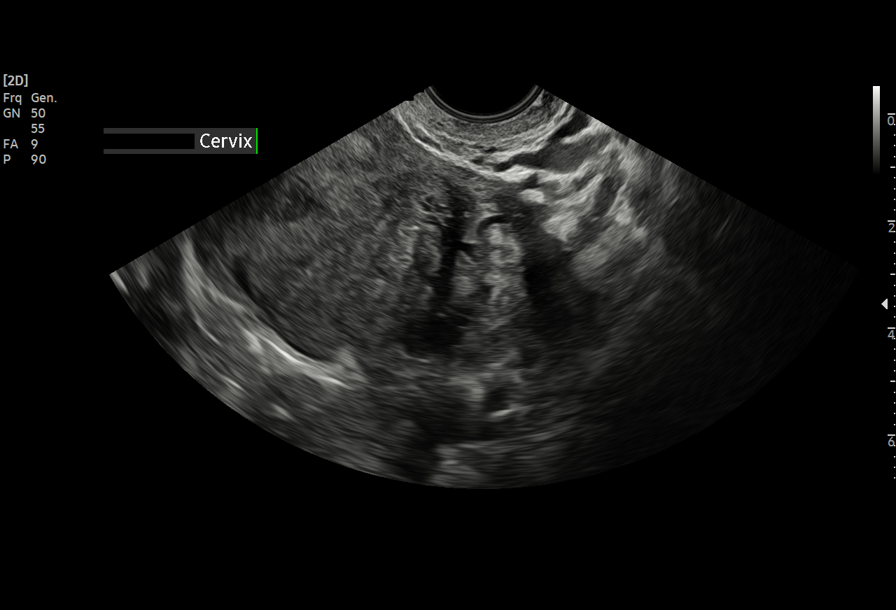
[im 3/9]
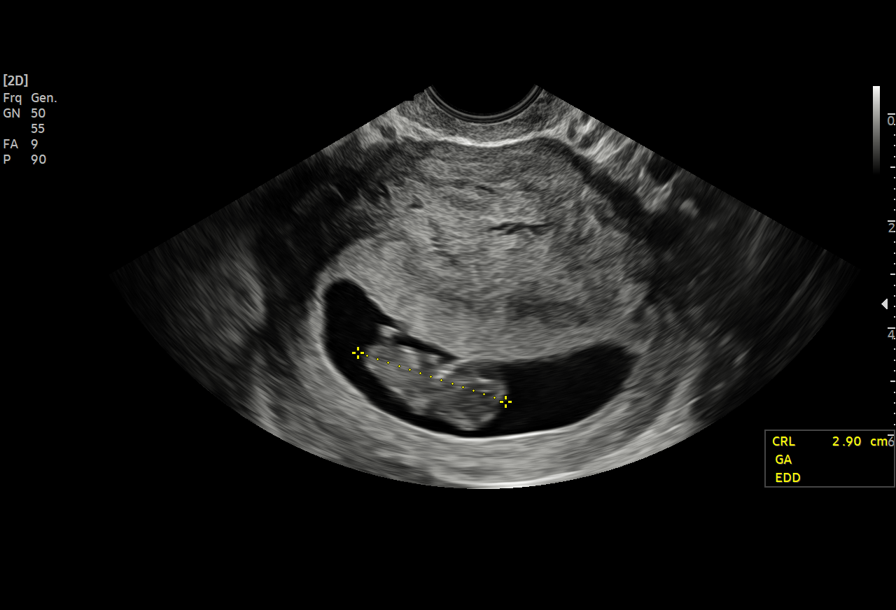
[im 4/9]
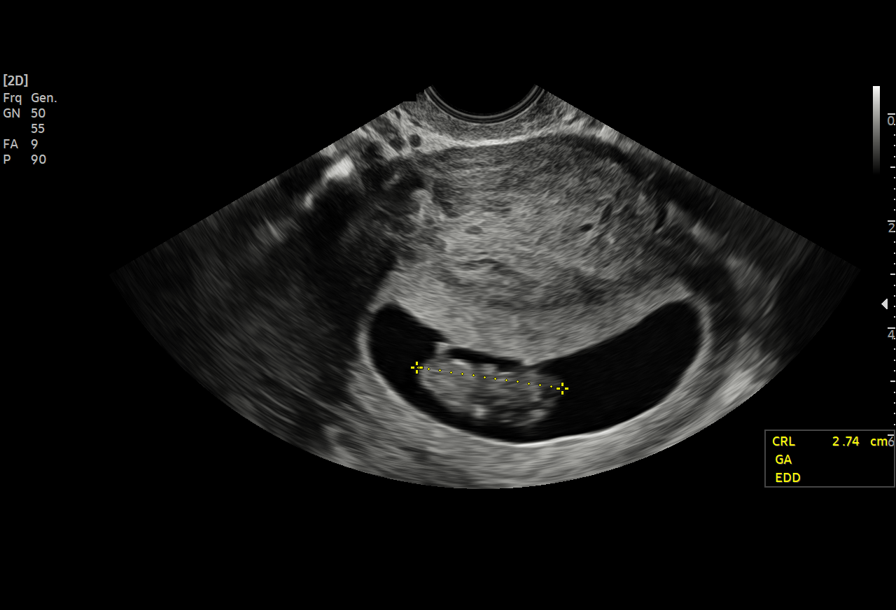
[im 5/9]
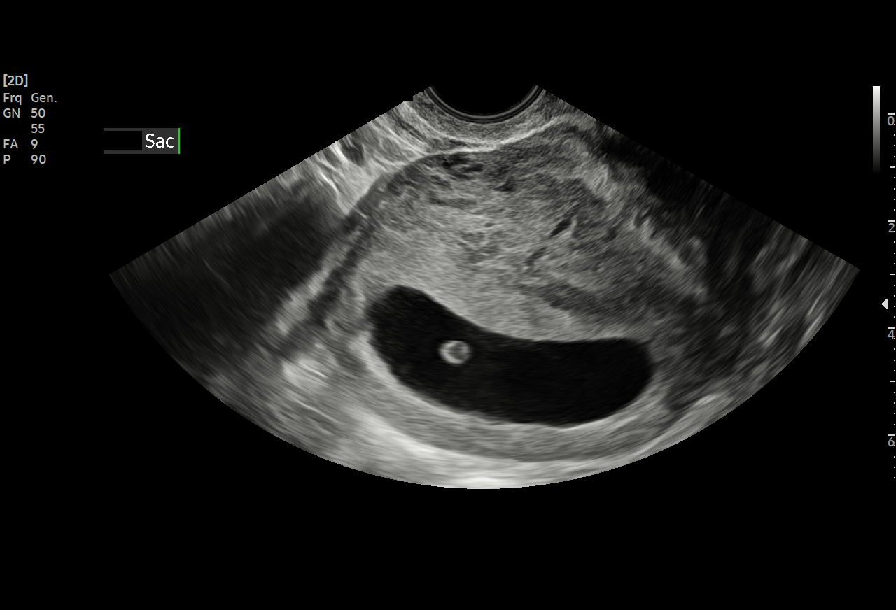
[im 6/9]
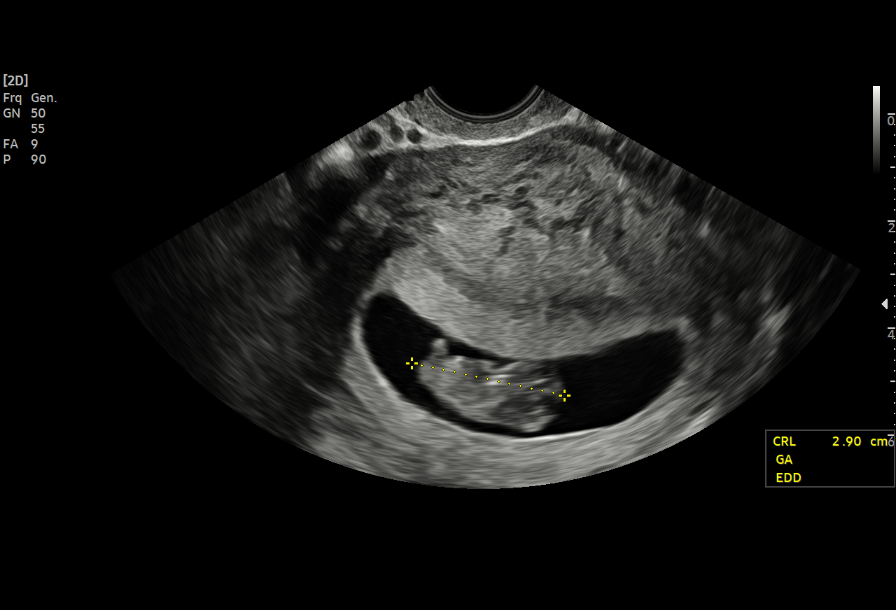
[im 7/9]
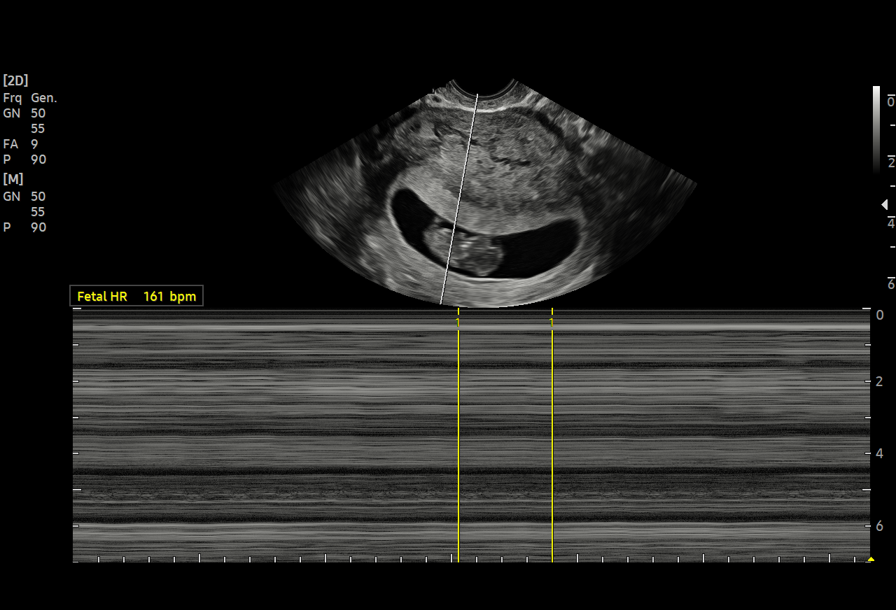
[im 8/9]
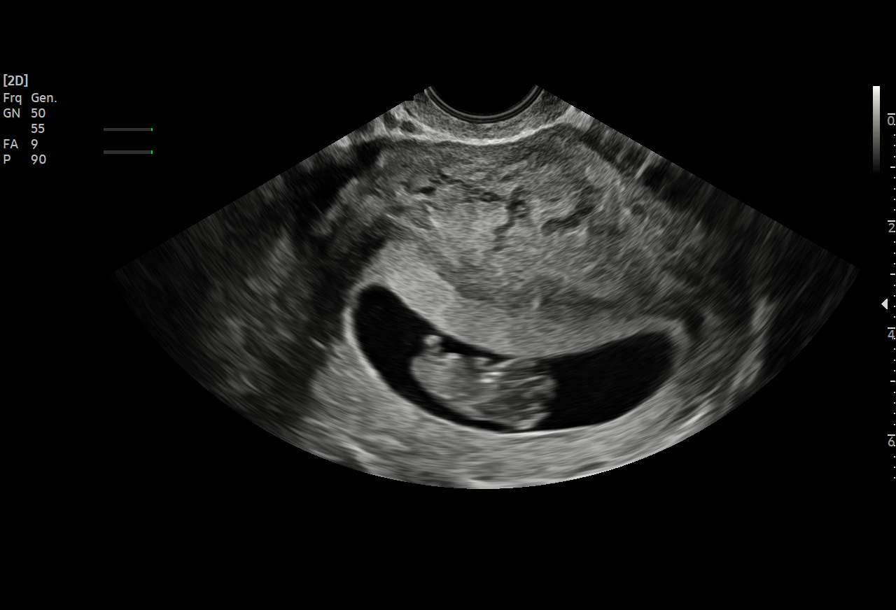
[im 9/9]
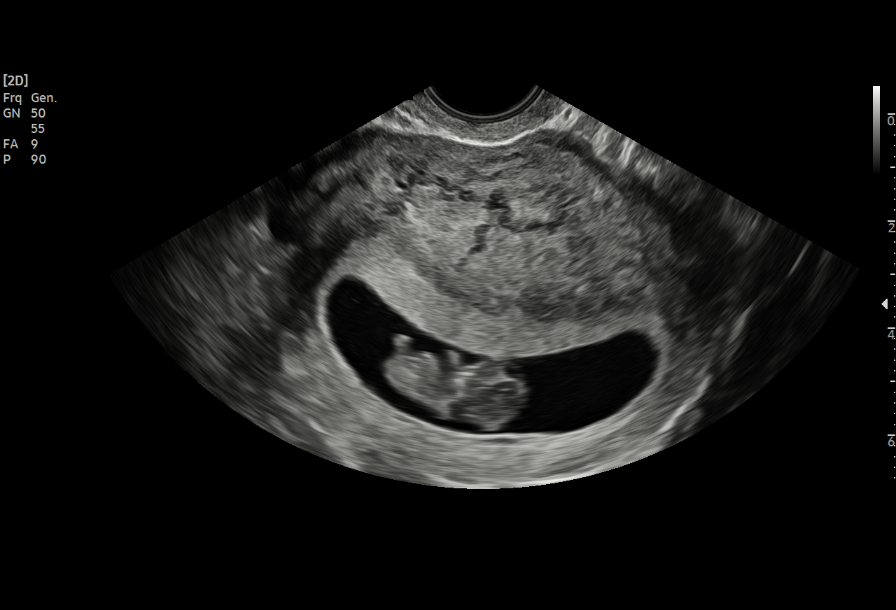

[9 of 9 positions shown; findings below may reference images not displayed]

[REDACTED]care

 1  [HOSPITAL]                         [address].0     [facility]

Indications

 9 weeks gestation of pregnancy
Fetal Evaluation

 Num Of Fetuses:          1
 Fetal Heart Rate(bpm):   161
 Cardiac Activity:        Observed
Biometry

 CRL:      28.5  mm     G. Age:  9w 4d                   EDD:    01/02/22
Gestational Age

 Best:          9w 4d      Det. By:  U/S C R L (06/03/21)     EDD:   01/02/22
Comments

 SINGLE LIVE iup AT 9W4D BY CRL. LMP 04/04/21
Impression

 Single IUP 9w4d bt CRL
 + cardiac activity
Recommendations

 F/U OB U/S as clinically indicated

## 2023-07-11 ENCOUNTER — Other Ambulatory Visit (INDEPENDENT_AMBULATORY_CARE_PROVIDER_SITE_OTHER): Payer: Self-pay

## 2023-07-11 ENCOUNTER — Ambulatory Visit (INDEPENDENT_AMBULATORY_CARE_PROVIDER_SITE_OTHER): Payer: No Typology Code available for payment source | Admitting: *Deleted

## 2023-07-11 VITALS — BP 109/71 | HR 67 | Wt 190.3 lb

## 2023-07-11 DIAGNOSIS — Z1339 Encounter for screening examination for other mental health and behavioral disorders: Secondary | ICD-10-CM

## 2023-07-11 DIAGNOSIS — Z3480 Encounter for supervision of other normal pregnancy, unspecified trimester: Secondary | ICD-10-CM | POA: Insufficient documentation

## 2023-07-11 DIAGNOSIS — Z3A10 10 weeks gestation of pregnancy: Secondary | ICD-10-CM

## 2023-07-11 DIAGNOSIS — O3680X0 Pregnancy with inconclusive fetal viability, not applicable or unspecified: Secondary | ICD-10-CM

## 2023-07-11 DIAGNOSIS — Z3481 Encounter for supervision of other normal pregnancy, first trimester: Secondary | ICD-10-CM | POA: Diagnosis not present

## 2023-07-11 MED ORDER — PROMETHAZINE HCL 25 MG PO TABS: 25.0000 mg | ORAL_TABLET | Freq: Four times a day (QID) | ORAL | 1 refills | Status: DC | PRN

## 2023-07-11 MED ORDER — DOXYLAMINE-PYRIDOXINE 10-10 MG PO TBEC: 2.0000 | DELAYED_RELEASE_TABLET | Freq: Every day | ORAL | 5 refills | Status: DC

## 2023-07-11 MED ORDER — BLOOD PRESSURE KIT DEVI: 1.0000 | 0 refills | Status: DC

## 2023-07-11 NOTE — Patient Instructions (Signed)

## 2023-07-11 NOTE — Progress Notes (Signed)
 New OB Intake  I connected with Nichole Finley  on 07/11/23 at 10:15 AM EDT by In Person Visit and verified that I am speaking with the correct person using two identifiers. Nurse is located at [facility] and pt is located at [place].  I discussed the limitations, risks, security and privacy concerns of performing an evaluation and management service by telephone and the availability of in person appointments. I also discussed with the patient that there may be a patient responsible charge related to this service. The patient expressed understanding and agreed to proceed.  I explained I am completing New OB Intake today. We discussed EDD of 02/06/2024, by Last Menstrual Period. Pt is G3P2002. I reviewed her allergies, medications and Medical/Surgical/OB history.    Patient Active Problem List   Diagnosis Date Noted   Supervision of other normal pregnancy, antepartum 07/11/2023   Indication for care in labor or delivery 01/09/2022   Carrier of spinal muscular atrophy 07/11/2021   Maternal obesity affecting pregnancy, antepartum 06/13/2021   Supervision of normal pregnancy 06/03/2021   False positive HIV serology 03/03/2017   Severe single current episode of major depressive disorder, without psychotic features (HCC)    MDD (major depressive disorder), single episode, moderate (HCC) 10/06/2014   Attention deficit hyperactivity disorder (ADHD), combined type, severe 10/06/2014   Oppositional defiant disorder 10/06/2014    Concerns addressed today  Delivery Plans Plans to deliver at [facility]. Discussed the nature of our practice with multiple providers including residents and students. Due to the size of the practice, the delivering provider may not be the same as those providing prenatal care.   Patient is not interested in water birth. Offered upcoming OB visit with CNM to discuss further.  MyChart/Babyscripts MyChart access verified. I explained pt will have some visits in office and some  virtually. Babyscripts instructions given and order placed. Patient verifies receipt of registration text/e-mail. Account successfully created and app downloaded. If patient is a candidate for Optimized scheduling, add to sticky note.   Blood Pressure Cuff/Weight Scale Patient has private insurance; instructed to purchase blood pressure cuff and bring to first prenatal appt. Explained after first prenatal appt pt will check weekly and document in Babyscripts. Patient does not have weight scale; patient may purchase if they desire to track weight weekly in Babyscripts.  Anatomy US Explained first scheduled [place] will be around 19 weeks. Anatomy US scheduled for TBD at TBD.  Interested in [place]? If yes, send referral and doula dot phrase.   Is patient a candidate for Babyscripts Optimization? Yes, patient accepted    First visit review I reviewed new OB appt with patient. Explained pt will be seen by [name], PA at first visit. Discussed [name] genetic screening with patient. Requests Panorama. Routine prenatal labs  collected at today's visit.    Last Pap Diagnosis  Date Value Ref Range Status  06/13/2021 (A)  Final   - Atypical squamous cells of undetermined significance (ASC-US)    Harrel Lemon, RN 07/11/2023  1:20 PM

## 2023-07-12 LAB — CBC/D/PLT+RPR+RH+ABO+RUBIGG...
Antibody Screen: NEGATIVE
Basophils Absolute: 0 10*3/uL (ref 0.0–0.2)
Basos: 0 %
EOS (ABSOLUTE): 0 10*3/uL (ref 0.0–0.4)
Eos: 0 %
HCV Ab: NONREACTIVE
HIV Screen 4th Generation wRfx: NONREACTIVE
Hematocrit: 42.4 % (ref 34.0–46.6)
Hemoglobin: 14.1 g/dL (ref 11.1–15.9)
Hepatitis B Surface Ag: NEGATIVE
Immature Grans (Abs): 0 10*3/uL (ref 0.0–0.1)
Immature Granulocytes: 0 %
Lymphocytes Absolute: 1.3 10*3/uL (ref 0.7–3.1)
Lymphs: 24 %
MCH: 31.1 pg (ref 26.6–33.0)
MCHC: 33.3 g/dL (ref 31.5–35.7)
MCV: 93 fL (ref 79–97)
Monocytes Absolute: 0.4 10*3/uL (ref 0.1–0.9)
Monocytes: 7 %
Neutrophils Absolute: 3.7 10*3/uL (ref 1.4–7.0)
Neutrophils: 69 %
Platelets: 173 10*3/uL (ref 150–450)
RBC: 4.54 x10E6/uL (ref 3.77–5.28)
RDW: 12.6 % (ref 11.7–15.4)
RPR Ser Ql: NONREACTIVE
Rh Factor: POSITIVE
Rubella Antibodies, IGG: 3.46 {index} (ref >0.99)
WBC: 5.5 10*3/uL (ref 3.4–10.8)

## 2023-07-12 LAB — HCV INTERPRETATION

## 2023-07-13 LAB — URINE CULTURE, OB REFLEX

## 2023-07-13 LAB — CULTURE, OB URINE

## 2023-07-18 LAB — PANORAMA PRENATAL TEST FULL PANEL:PANORAMA TEST PLUS 5 ADDITIONAL MICRODELETIONS: FETAL FRACTION: 7.2

## 2023-08-01 ENCOUNTER — Telehealth: Payer: Self-pay

## 2023-08-01 ENCOUNTER — Encounter: Payer: No Typology Code available for payment source | Admitting: Medical

## 2023-08-21 NOTE — Progress Notes (Unsigned)
 PRENATAL VISIT NOTE  Subjective:  Nichole Finley is a 23 y.o. G3P2002 at [redacted]w[redacted]d being seen today for her first prenatal visit for this pregnancy.  She is currently monitored for the following issues for this pregnancy and has MDD (major depressive disorder), single episode, moderate (HCC); Attention deficit hyperactivity disorder (ADHD), combined type, severe; Oppositional defiant disorder; Severe single current episode of major depressive disorder, without psychotic features (HCC); False positive HIV serology; Maternal obesity affecting pregnancy, antepartum; Carrier of spinal muscular atrophy; and Supervision of other normal pregnancy, antepartum on their problem list.  Patient reports no complaints.  Contractions: Not present. Vag. Bleeding: None.  Movement: Present. Denies leaking of fluid.   The following portions of the patient's history were reviewed and updated as appropriate: allergies, current medications, past family history, past medical history, past social history, past surgical history and problem list.   Objective:   Vitals:   08/22/23 1448  BP: 120/72  Pulse: 91  Weight: 180 lb 9.6 oz (81.9 kg)    Fetal Status: Fetal Heart Rate (bpm): 153 Fundal Height: 16 cm Movement: Present     General:  Alert, oriented and cooperative. Patient is in no acute distress.  Skin: Skin is warm and dry. No rash noted.   Cardiovascular: Normal heart rate and rhythm noted  Respiratory: Normal respiratory effort, no problems with respiration noted. Clear to auscultation.   Abdomen: Soft, gravid, appropriate for gestational age. Normal bowel sounds. Non-tender. Pain/Pressure: Absent     Pelvic: Cervical exam deferred       Normal cervical contour, no lesions, no bleeding following pap, normal discharge  Extremities: Normal range of motion.  Edema: None  Mental Status: Normal mood and affect. Normal behavior. Normal judgment and thought content.    Indications for ASA therapy (per  uptodate) One of the following: Previous pregnancy with preeclampsia, especially early onset and with an adverse outcome No Multifetal gestation No Chronic hypertension No Type 1 or 2 diabetes mellitus No Chronic kidney disease No Autoimmune disease (antiphospholipid syndrome, systemic lupus erythematosus) No  Two or more of the following: Nulliparity No Obesity (body mass index >30 kg/m2) No Family history of preeclampsia in mother or sister No Age >=35 years No Sociodemographic characteristics (African American race, low socioeconomic level) Yes Personal risk factors (eg, previous pregnancy with low birth weight or small for gestational age infant, previous adverse pregnancy outcome [eg, stillbirth], interval >10 years between pregnancies) No   Assessment and Plan:  Pregnancy: G3P2002 at [redacted]w[redacted]d  1. Supervision of other normal pregnancy, antepartum (Primary) Initial labs drawn. Continue prenatal vitamins. Genetic Screening discussed: NIPS, carrier screening and AFP  Ultrasound discussed; fetal anatomic survey: Ordered Problem list reviewed and updated. Reviewed Brx optimized schedule, patient agreeable The nature of [facility] with multiple MDs and other Advanced Practice Providers was explained to patient; also emphasized that residents, students are part of our team. Routine obstetric precautions reviewed.  - Cervicovaginal ancillary only - Hemoglobin A1c - AFP, Serum, Open Spina Bifida  2. [redacted] weeks gestation of pregnancy Anticipatory guidance about next weeks/visits of pregnancy given  3. Cervical cancer screening declined   Preterm labor/first trimester warning symptoms and general obstetric precautions including but not limited to vaginal bleeding, contractions, leaking of fluid and fetal movement were reviewed in detail with the patient.  Please refer to After Visit Summary for other counseling recommendations.    Return in about  4 weeks (around 09/19/2023) for LOB.  No future appointments.  Jasreet Dickie E [place] Vanover, PA-C

## 2023-08-22 ENCOUNTER — Ambulatory Visit (INDEPENDENT_AMBULATORY_CARE_PROVIDER_SITE_OTHER): Admitting: Physician Assistant

## 2023-08-22 ENCOUNTER — Other Ambulatory Visit (HOSPITAL_COMMUNITY)
Admission: RE | Admit: 2023-08-22 | Discharge: 2023-08-22 | Disposition: A | Discharge status: Home / Self Care | Source: Ambulatory Visit | Attending: Physician Assistant | Admitting: Physician Assistant

## 2023-08-22 VITALS — BP 120/72 | HR 91 | Wt 180.6 lb

## 2023-08-22 DIAGNOSIS — Z5320 Procedure and treatment not carried out because of patient's decision for unspecified reasons: Secondary | ICD-10-CM

## 2023-08-22 DIAGNOSIS — Z3480 Encounter for supervision of other normal pregnancy, unspecified trimester: Secondary | ICD-10-CM | POA: Insufficient documentation

## 2023-08-22 DIAGNOSIS — Z1339 Encounter for screening examination for other mental health and behavioral disorders: Secondary | ICD-10-CM

## 2023-08-22 DIAGNOSIS — Z3A16 16 weeks gestation of pregnancy: Secondary | ICD-10-CM | POA: Diagnosis not present

## 2023-08-22 DIAGNOSIS — Z3482 Encounter for supervision of other normal pregnancy, second trimester: Secondary | ICD-10-CM | POA: Diagnosis not present

## 2023-08-22 NOTE — Progress Notes (Signed)
 Pt presents for new ob. Pt has no questions or concerns at this time.

## 2023-08-24 ENCOUNTER — Encounter: Payer: Self-pay | Admitting: Physician Assistant

## 2023-08-24 LAB — AFP, SERUM, OPEN SPINA BIFIDA
AFP MoM: 1.27
AFP Value: 41.1 ng/mL
Gest. Age on Collection Date: 16 wk
Maternal Age At EDD: 24.8 a
OSBR Risk 1 IN: 10000
Test Results:: NEGATIVE
Weight: 180 [lb_av]

## 2023-08-24 LAB — CERVICOVAGINAL ANCILLARY ONLY
Chlamydia: NEGATIVE
Comment: NEGATIVE
Comment: NEGATIVE
Comment: NORMAL
Neisseria Gonorrhea: NEGATIVE
Trichomonas: NEGATIVE

## 2023-08-24 LAB — HEMOGLOBIN A1C
Est. average glucose Bld gHb Est-mCnc: 80 mg/dL
Hgb A1c MFr Bld: 4.4 % — ABNORMAL LOW (ref 4.8–5.6)

## 2023-09-10 IMAGING — US US MFM OB DETAIL+14 WK
1 series · 13 of 28 positions shown · non-contrast
Comparison: none

[Series 1: us mfm ob detail+14 wk · 73 acquisitions, 13 frames shown]
[im 3/73]
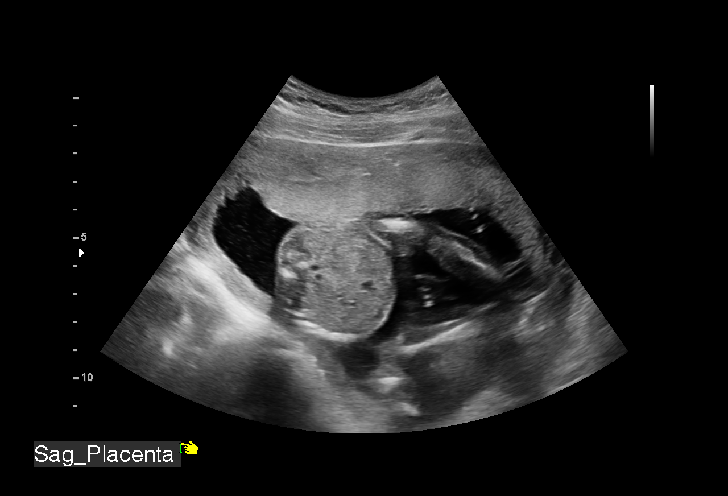
[im 9/73]
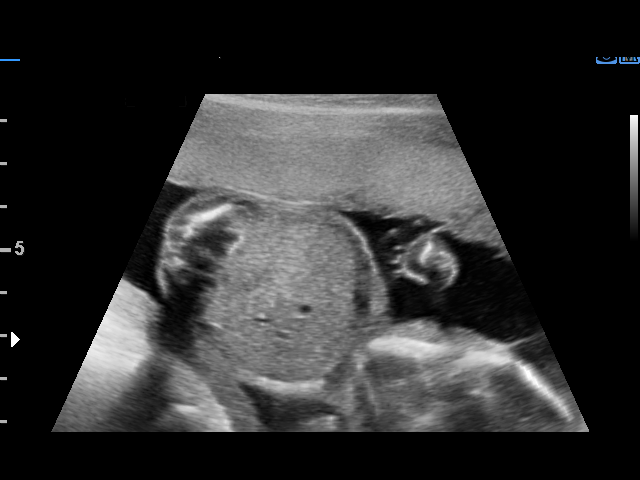
[im 14/73]
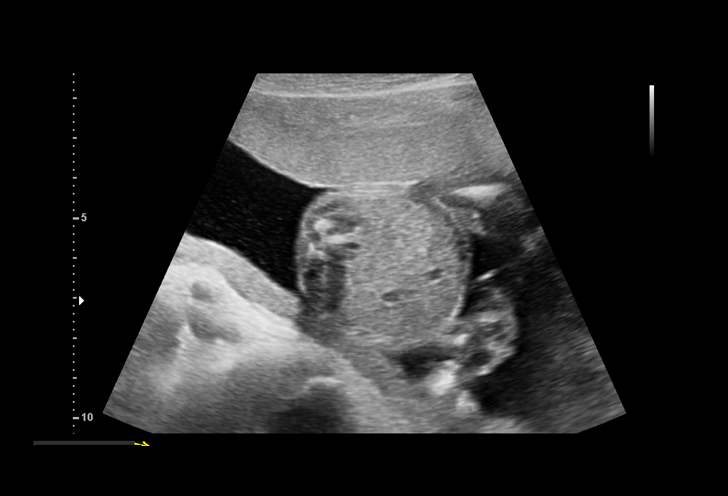
[im 19/73]
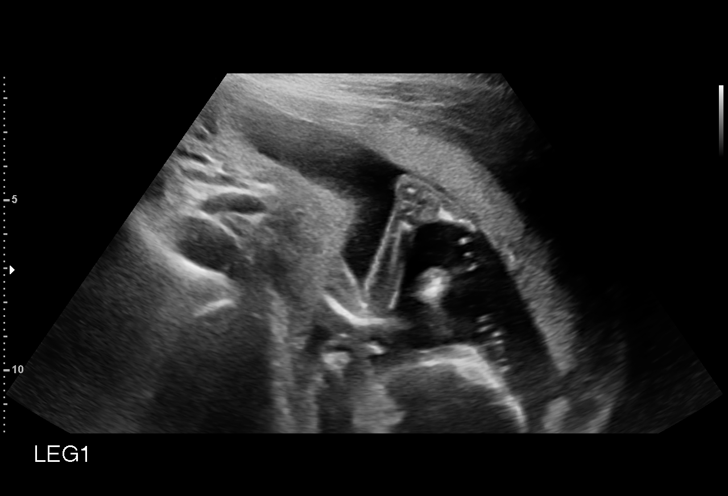
[im 25/73]
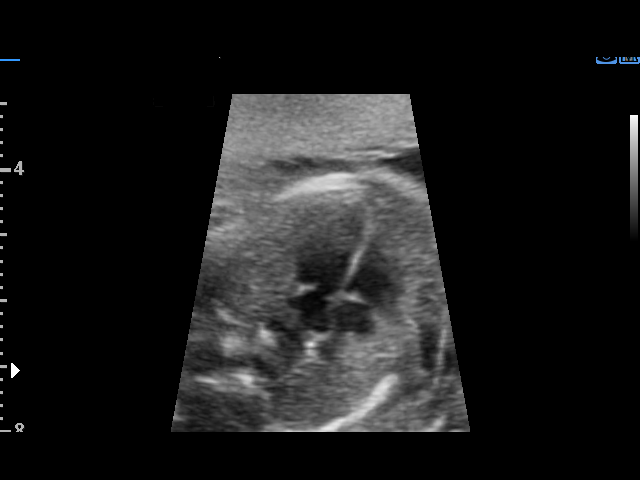
[im 30/73]
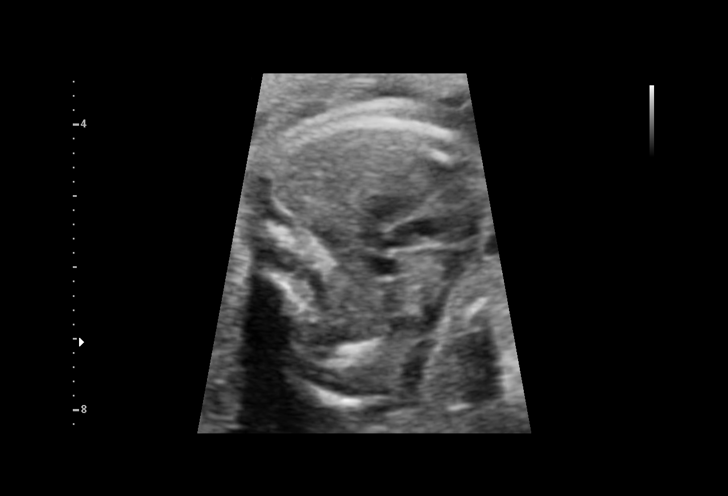
[im 38/73]
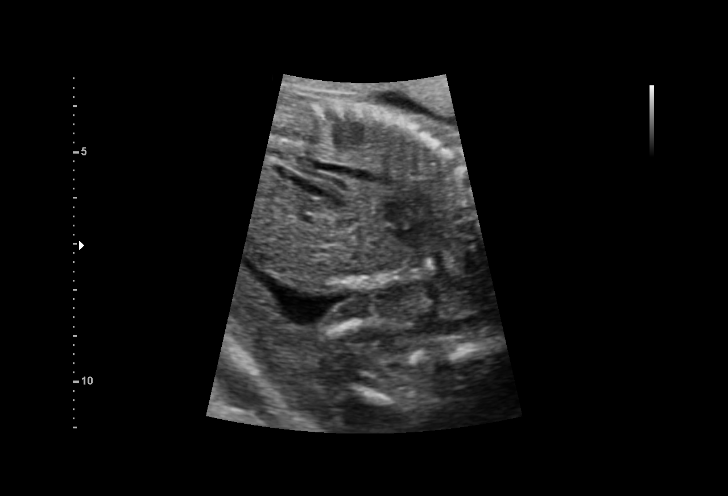
[im 43/73]
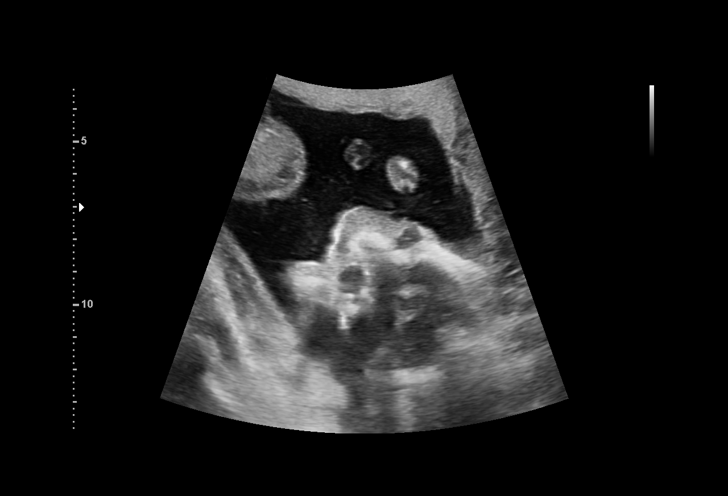
[im 49/73]
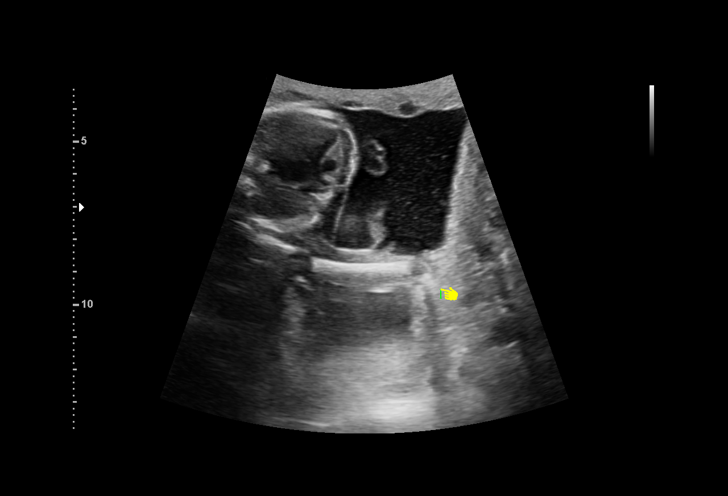
[im 54/73]
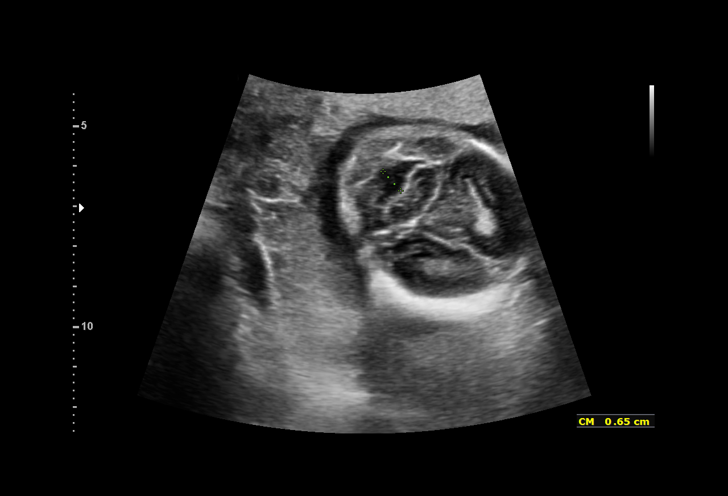
[im 59/73]
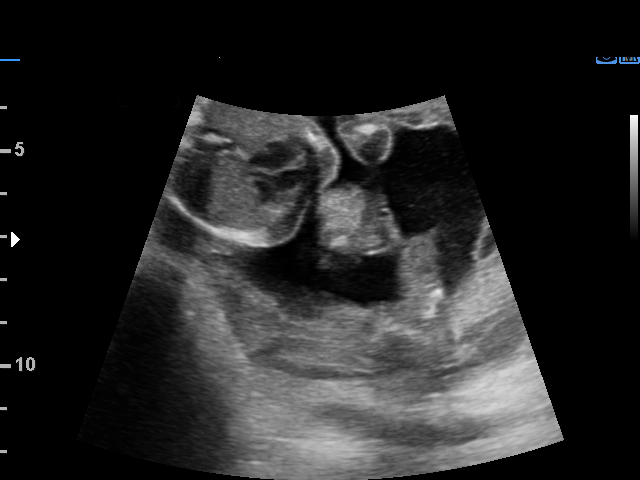
[im 65/73]
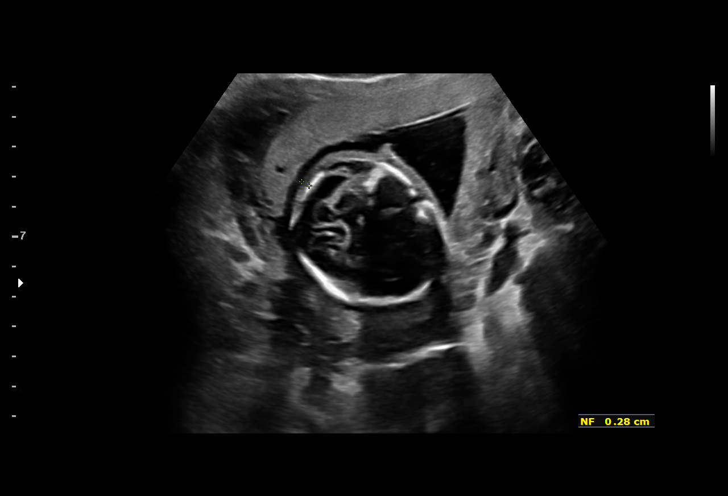
[im 70/73]
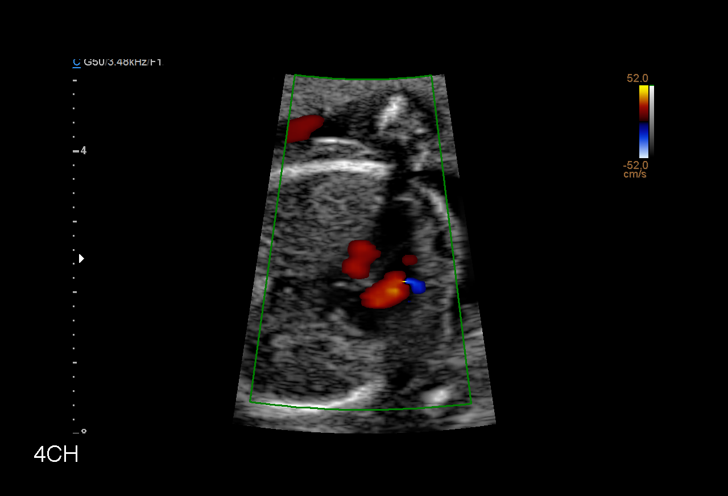

[13 of 28 positions shown; findings below may reference images not displayed]

Indications

 19 weeks gestation of pregnancy
 Genetic carrier - increased carrier risk for
 SMA
 LR NIPS, Neg AFP
Fetal Evaluation

 Num Of Fetuses:         1
 Fetal Heart Rate(bpm):  143
 Cardiac Activity:       Observed
 Presentation:           Cephalic
 Placenta:               Anterior
 P. Cord Insertion:      Visualized, central

 Amniotic Fluid
 AFI FV:      Within normal limits

                             Largest Pocket(cm)

Biometry

 BPD:      44.6  mm     G. Age:  19w 3d         71  %    CI:        72.58   %    70 - 86
                                                         FL/HC:      18.1   %    16.1 -
 HC:      166.5  mm     G. Age:  19w 2d         59  %    HC/AC:      1.14        1.09 -
 AC:      145.9  mm     G. Age:  19w 6d         75  %    FL/BPD:     67.5   %
 FL:       30.1  mm     G. Age:  19w 2d         55  %    FL/AC:      20.6   %    20 - 24
 HUM:        30  mm     G. Age:  19w 6d         73  %
 CER:      17.7  mm     G. Age:  17w 5d      < 2.3  %
 NFT:       2.8  mm

 LV:        4.5  mm
 CM:        5.5  mm

 Est. FW:     301  gm    0 lb 11 oz      79  %
OB History

 Gravidity:    2         Term:   1
 Living:       1
Gestational Age

 LMP:           18w 0d        Date:  04/04/21                  EDD:   01/09/22
 U/S Today:     19w 3d                                        EDD:   12/30/21
 Best:          19w 0d     Det. By:  U/S C R L  (06/03/21)    EDD:   01/02/22
Anatomy

 Cranium:               Appears normal         Aortic Arch:            Not well visualized
 Cavum:                 Not well visualized    Ductal Arch:            Appears normal
 Ventricles:            Appears normal         Diaphragm:              Appears normal
 Choroid Plexus:        Appears normal         Stomach:                Appears normal, left
                                                                       sided
 Cerebellum:            Appears normal         Abdomen:                Appears normal
 Posterior Fossa:       Appears normal         Abdominal Wall:         Appears nml (cord
                                                                       insert, abd wall)
 Nuchal Fold:           Appears normal         Cord Vessels:           Appears normal (3
                                                                       vessel cord)
 Face:                  Appears normal         Kidneys:                Appear normal
                        (orbits and profile)
 Lips:                  Appears normal         Bladder:                Appears normal
 Thoracic:              Appears normal         Spine:                  Appears normal
 Heart:                 Not well visualized    Upper Extremities:      Not well visualized
 RVOT:                  Not well visualized    Lower Extremities:      Appears normal
 LVOT:                  Appears normal

 Other:  VC, 3VV and 3VTV visualized. Fetus appears to be female.
         Technically difficult due to fetal position.
Cervix Uterus Adnexa

 Cervix
 Length:           3.72  cm.
 Normal appearance by transabdominal scan.

 Right Ovary
 Visualized.

 Left Ovary
 Not visualized.
Impression
 G2 P1. Patient is here for fetal anatomy scan.

 On cell-free fetal DNA screening, the risks of fetal
 aneuploidies are not increased .MSAFP screening showed
 low risk for open-neural tube defects .

 Obstetric history significant for a term vaginal delivery.

 We performed fetal anatomy scan. No makers of
 aneuploidies or fetal structural defects are seen. Fetal
 biometry is consistent with her previously-established dates.
 Amniotic fluid is normal and good fetal activity is seen.
 Patient understands the limitations of ultrasound in detecting
 fetal anomalies.
 Patient has an increased carrier risk for spinal muscular
 atrophy.  After ultrasound, the patient met with our genetic
 counselor.  He will be is receiving a separate letter.
Recommendations

 -An appointment was made for her to return in 4 weeks for
 completion of fetal anatomy.
                [place], Karlitaa

## 2023-09-19 ENCOUNTER — Encounter: Admitting: Obstetrics

## 2023-10-09 ENCOUNTER — Telehealth: Payer: Self-pay | Admitting: Licensed Clinical Social Worker

## 2023-10-09 NOTE — Telephone Encounter (Signed)
 [facility] contacted patient on this date to provide [facility] intro and to schedule [facility] appointment. [facility] left VM.

## 2023-10-19 ENCOUNTER — Other Ambulatory Visit

## 2023-10-19 ENCOUNTER — Ambulatory Visit

## 2023-10-19 DIAGNOSIS — Z3480 Encounter for supervision of other normal pregnancy, unspecified trimester: Secondary | ICD-10-CM

## 2023-11-01 ENCOUNTER — Other Ambulatory Visit: Payer: Self-pay | Admitting: *Deleted

## 2023-11-01 ENCOUNTER — Ambulatory Visit

## 2023-11-01 ENCOUNTER — Other Ambulatory Visit: Payer: Self-pay | Admitting: Obstetrics and Gynecology

## 2023-11-01 ENCOUNTER — Ambulatory Visit: Attending: Obstetrics and Gynecology | Admitting: Obstetrics and Gynecology

## 2023-11-01 VITALS — BP 135/56 | HR 71

## 2023-11-01 DIAGNOSIS — Z148 Genetic carrier of other disease: Secondary | ICD-10-CM | POA: Insufficient documentation

## 2023-11-01 DIAGNOSIS — O09899 Supervision of other high risk pregnancies, unspecified trimester: Secondary | ICD-10-CM

## 2023-11-01 DIAGNOSIS — Z3A26 26 weeks gestation of pregnancy: Secondary | ICD-10-CM | POA: Insufficient documentation

## 2023-11-01 DIAGNOSIS — Z3480 Encounter for supervision of other normal pregnancy, unspecified trimester: Secondary | ICD-10-CM | POA: Diagnosis not present

## 2023-11-01 DIAGNOSIS — E669 Obesity, unspecified: Secondary | ICD-10-CM | POA: Diagnosis not present

## 2023-11-01 DIAGNOSIS — O99212 Obesity complicating pregnancy, second trimester: Secondary | ICD-10-CM

## 2023-11-01 DIAGNOSIS — Z362 Encounter for other antenatal screening follow-up: Secondary | ICD-10-CM

## 2023-11-01 DIAGNOSIS — O09892 Supervision of other high risk pregnancies, second trimester: Secondary | ICD-10-CM

## 2023-11-01 DIAGNOSIS — O99210 Obesity complicating pregnancy, unspecified trimester: Secondary | ICD-10-CM

## 2023-11-01 DIAGNOSIS — Z363 Encounter for antenatal screening for malformations: Secondary | ICD-10-CM | POA: Insufficient documentation

## 2023-11-01 DIAGNOSIS — O3680X0 Pregnancy with inconclusive fetal viability, not applicable or unspecified: Secondary | ICD-10-CM

## 2023-11-01 NOTE — Progress Notes (Signed)
 Maternal-Fetal Medicine Consultation Name: Nichole Finley MRN: [id number]  G3 P2002 at 26w 1d gestation.  Patient is here for fetal anatomy scan. On cell-free fetal DNA screening, the risks of aneuploidies are not increased. MSAFP screening showed low risk for open-neural tube defects. Obstetrical history significant for 2 term vaginal deliveries.  Ultrasound We performed fetal anatomy scan. No makers of aneuploidies or fetal structural defects are seen. Fetal biometry is consistent with her previously-established dates. Amniotic fluid is normal and good fetal activity is seen. Patient understands the limitations of ultrasound in detecting fetal anomalies.   I counseled the patient on the following Short Interpregnancy Interval It is defined as the time interval (18 to 24 months) between the end of previous pregnancy and the beginning of next pregnancy.  The impact of short pregnancy interval on the outcome of subsequent pregnancy is uncertain. Some studies have shown that congenital anomalies, preterm delivery, and fetal growth restriction rates are increased in pregnancies with short interpregnancy interval.  However, it is not supported by other reports. Overall, we should expect good pregnancy outcomes if there are no other high-risk factors.  Increased Risk to be a Silent Carrier of Spinal Muscular Atrophy (SMA) Patient has increased carrier risk for spinal muscular atrophy (SMA) Patient was counseled that she has a 3% chance of being a true carrier of SMA. I recommended partner screening. Her partner (Tree surgeon) has a 1 in 79 chance of being a silent carrier. If her partner is also a silent carrier, the likelihood of having an affected baby (SMA) is very low (about 1 in 4,600).  I recommended genetic counseling and partner screening.  Patient opted not to have genetic counseling or partner screening.  Recommendations - An appointment was made for her to return in 6 weeks for fetal  growth assessment (short interpregnancy interval).  Consultation including face-to-face (more than 50%) counseling 30 minutes.

## 2023-11-19 ENCOUNTER — Ambulatory Visit (INDEPENDENT_AMBULATORY_CARE_PROVIDER_SITE_OTHER): Admitting: Obstetrics and Gynecology

## 2023-11-19 ENCOUNTER — Encounter: Payer: Self-pay | Admitting: Obstetrics and Gynecology

## 2023-11-19 VITALS — BP 110/65 | HR 76 | Wt 191.0 lb

## 2023-11-19 DIAGNOSIS — Z3480 Encounter for supervision of other normal pregnancy, unspecified trimester: Secondary | ICD-10-CM | POA: Diagnosis not present

## 2023-11-19 DIAGNOSIS — Z3A28 28 weeks gestation of pregnancy: Secondary | ICD-10-CM

## 2023-11-19 DIAGNOSIS — O99210 Obesity complicating pregnancy, unspecified trimester: Secondary | ICD-10-CM | POA: Diagnosis not present

## 2023-11-19 NOTE — Progress Notes (Signed)
   PRENATAL VISIT NOTE  Subjective:  Nichole Finley is a 23 y.o. G3P2002 at [redacted]w[redacted]d being seen today for ongoing prenatal care.  She is currently monitored for the following issues for this low-risk pregnancy and has MDD (major depressive disorder), single episode, moderate (HCC); Attention deficit hyperactivity disorder (ADHD), combined type, severe; Oppositional defiant disorder; Severe single current episode of major depressive disorder, without psychotic features (HCC); False positive HIV serology; Maternal obesity affecting pregnancy, antepartum; Carrier of spinal muscular atrophy; and Supervision of other normal pregnancy, antepartum on their problem list.  Patient reports no complaints.  Contractions: Irritability. Vag. Bleeding: None.  Movement: Present. Denies leaking of fluid.   The following portions of the patient's history were reviewed and updated as appropriate: allergies, current medications, past family history, past medical history, past social history, past surgical history and problem list.   Objective:    Vitals:   11/19/23 1114  BP: 110/65  Pulse: 76  Weight: 191 lb (86.6 kg)    Fetal Status:  Fetal Heart Rate (bpm): 154 Fundal Height: 28 cm Movement: Present    General: Alert, oriented and cooperative. Patient is in no acute distress.  Skin: Skin is warm and dry. No rash noted.   Cardiovascular: Normal heart rate noted  Respiratory: Normal respiratory effort, no problems with respiration noted  Abdomen: Soft, gravid, appropriate for gestational age.  Pain/Pressure: Absent     Pelvic: Cervical exam deferred        Extremities: Normal range of motion.  Edema: Trace  Mental Status: Normal mood and affect. Normal behavior. Normal judgment and thought content.   Assessment and Plan:  Pregnancy: G3P2002 at [redacted]w[redacted]d 1. Supervision of other normal pregnancy, antepartum (Primary) Patient is doing well without complaints Missed several appointments- will plan third trimester  labs and glucola at next visit Patient interested in waterbirth- information provided Plans Nexplanon for contraception Follow up growth ultrasound Information for doula also provided  2. Obesity affecting pregnancy, antepartum, unspecified obesity type   Preterm labor symptoms and general obstetric precautions including but not limited to vaginal bleeding, contractions, leaking of fluid and fetal movement were reviewed in detail with the patient. Please refer to After Visit Summary for other counseling recommendations.   Return in about 2 weeks (around 12/03/2023) for in person, ROB, High risk, 2 hr glucola next visit.  Future Appointments  Date Time Provider Department Center  12/14/2023  2:15 PM [facility] PROVIDER 1 [facility]  12/14/2023  2:30 PM [facility] US1 [facility]    Winton Felt, MD

## 2023-11-19 NOTE — Progress Notes (Signed)
 ROB, Pt is interested in Water birth.

## 2023-11-19 NOTE — Patient Instructions (Signed)
   Considering Waterbirth? Guide for patients at Center for [facility]) Why consider waterbirth? Gentle birth for babies  Less pain medicine used in labor  May allow for passive descent/less pushing  May reduce perineal tears  More mobility and instinctive maternal position changes  Increased maternal relaxation   Is waterbirth safe? What are the risks of infection, drowning or other complications? Infection:  Very low risk (3.7 % for tub vs 4.8% for bed)  7 in 8000 waterbirths with documented infection  Poorly cleaned equipment most common cause  Slightly lower group B strep transmission rate  Drowning  Maternal:  Very low risk  Related to seizures or fainting  Newborn:  Very low risk. No evidence of increased risk of respiratory problems in multiple large studies  Physiological protection from breathing under water  Avoid underwater birth if there are any fetal complications  Once baby's head is out of the water, keep it out.  Birth complication  Some reports of cord trauma, but risk decreased by bringing baby to surface gradually  No evidence of increased risk of shoulder dystocia. Mothers can usually change positions faster in water than in a bed, possibly aiding the maneuvers to free the shoulder.   There are 2 things you MUST do to have a waterbirth with [facility]: Attend a waterbirth class at [facility]   3rd Wednesday of every month from 7-9 pm (virtual during COVID) [employer] at [email or web address] or [email or web address] or by calling [phone] Bring [place] the certificate from the class to your prenatal appointment or send via MyChart Meet with a midwife at 36 weeks* to see if you can still plan a waterbirth and to sign the consent.   *We also recommend that you schedule as many of your prenatal visits with a midwife as possible.    Helpful information: You may want to bring a bathing suit top to the hospital  to wear during labor but this is optional.  All other supplies are provided by the hospital. Please arrive at the hospital with signs of active labor, and do not wait at home until late in labor. It takes 45 min- 1 hour for fetal monitoring, and check in to your room to take place, plus transport and filling of the waterbirth tub.    Things that would prevent you from having a waterbirth: Premature, <37wks  Previous cesarean birth  Presence of thick meconium-stained fluid  Multiple gestation (Twins, triplets, etc.)  Uncontrolled diabetes or gestational diabetes requiring medication  Hypertension diagnosed in pregnancy or preexisting hypertension (gestational hypertension, preeclampsia, or chronic hypertension) Fetal growth restriction (your baby measures less than 10th percentile on ultrasound) Heavy vaginal bleeding  Non-reassuring fetal heart rate  Active infection (MRSA, etc.). Group B Strep is NOT a contraindication for waterbirth.  If your labor has to be induced and induction method requires continuous monitoring of the baby's heart rate  Other risks/issues identified by your obstetrical provider   Please remember that birth is unpredictable. Under certain unforeseeable circumstances your provider may advise against giving birth in the tub. These decisions will be made on a case-by-case basis and with the safety of you and your baby as our highest priority.    Updated 08/03/21

## 2023-12-03 ENCOUNTER — Ambulatory Visit (INDEPENDENT_AMBULATORY_CARE_PROVIDER_SITE_OTHER): Admitting: Obstetrics and Gynecology

## 2023-12-03 ENCOUNTER — Other Ambulatory Visit

## 2023-12-03 VITALS — BP 128/69 | HR 78 | Wt 196.0 lb

## 2023-12-03 DIAGNOSIS — Z3A30 30 weeks gestation of pregnancy: Secondary | ICD-10-CM

## 2023-12-03 DIAGNOSIS — Z23 Encounter for immunization: Secondary | ICD-10-CM

## 2023-12-03 DIAGNOSIS — Z3483 Encounter for supervision of other normal pregnancy, third trimester: Secondary | ICD-10-CM

## 2023-12-03 DIAGNOSIS — Z3480 Encounter for supervision of other normal pregnancy, unspecified trimester: Secondary | ICD-10-CM

## 2023-12-03 DIAGNOSIS — O219 Vomiting of pregnancy, unspecified: Secondary | ICD-10-CM

## 2023-12-03 MED ORDER — ONDANSETRON HCL 4 MG PO TABS
4.0000 mg | ORAL_TABLET | Freq: Three times a day (TID) | ORAL | 0 refills | Status: DC | PRN
Start: 2023-12-03 — End: 2024-01-17

## 2023-12-03 NOTE — Progress Notes (Signed)
 Pt presents for rob. Pt needs to come back for her gtt. Pt needs something for

## 2023-12-03 NOTE — Addendum Note (Signed)
 Addended by: MARCINE GAINS on: 12/03/2023 10:09 AM   Modules accepted: Orders

## 2023-12-03 NOTE — Progress Notes (Signed)
   PRENATAL VISIT NOTE  Subjective:  Nichole Finley is a 23 y.o. G3P2002 at [redacted]w[redacted]d being seen today for ongoing prenatal care.  She is currently monitored for the following issues for this low-risk pregnancy and has MDD (major depressive disorder), single episode, moderate (HCC); Attention deficit hyperactivity disorder (ADHD), combined type, severe; Oppositional defiant disorder; Severe single current episode of major depressive disorder, without psychotic features (HCC); False positive HIV serology; Maternal obesity affecting pregnancy, antepartum; Carrier of spinal muscular atrophy; and Supervision of other normal pregnancy, antepartum on their problem list.  Patient reports nausea.  Contractions: Irritability. Vag. Bleeding: None.  Movement: Present. Denies leaking of fluid.   The following portions of the patient's history were reviewed and updated as appropriate: allergies, current medications, past family history, past medical history, past social history, past surgical history and problem list.   Objective:    Vitals:   12/03/23 0924  BP: 128/69  Pulse: 78  Weight: 196 lb (88.9 kg)    Fetal Status:  Fetal Heart Rate (bpm): 150   Movement: Present    General: Alert, oriented and cooperative. Patient is in no acute distress.  Skin: Skin is warm and dry. No rash noted.   Cardiovascular: Normal heart rate noted  Respiratory: Normal respiratory effort, no problems with respiration noted  Abdomen: Soft, gravid, appropriate for gestational age.  Pain/Pressure: Present     Pelvic: Cervical exam deferred        Extremities: Normal range of motion.  Edema: Trace (Feet)  Mental Status: Normal mood and affect. Normal behavior. Normal judgment and thought content.   Assessment and Plan:  Pregnancy: G3P2002 at [redacted]w[redacted]d 1. Supervision of other normal pregnancy, antepartum (Primary) BP and FHR normal Doing well, feeling regular movement  Follow up u/s 8/4 - HIV antibody (with reflex) - RPR -  CBC - Glucose Tolerance, 2 Hours w/1 Hour  2. [redacted] weeks gestation of pregnancy Unable to complete GTT today, reschedule for later this week Getting Tdap today   3. Nausea and vomiting in pregnancy Rx sent  - ondansetron  (ZOFRAN ) 4 MG tablet; Take 1 tablet (4 mg total) by mouth every 8 (eight) hours as needed for nausea or vomiting.  Dispense: 20 tablet; Refill: 0   Preterm labor symptoms and general obstetric precautions including but not limited to vaginal bleeding, contractions, leaking of fluid and fetal movement were reviewed in detail with the patient. Please refer to After Visit Summary for other counseling recommendations.   Return reschedule lab visit 2hr GTT for later this week and then 2 weeks for OB visit.  Future Appointments  Date Time Provider Department Center  12/14/2023  2:15 PM [facility] PROVIDER 1 [facility]  12/14/2023  2:30 PM [facility] US1 [facility]    Nidia Daring, FNP

## 2023-12-05 ENCOUNTER — Ambulatory Visit: Payer: Self-pay | Admitting: Obstetrics and Gynecology

## 2023-12-05 LAB — GLUCOSE TOLERANCE, 2 HOURS W/ 1HR: Glucose, Fasting: 73 mg/dL (ref 70–91)

## 2023-12-05 LAB — CBC
Hematocrit: 38 % (ref 34.0–46.6)
Hemoglobin: 12.3 g/dL (ref 11.1–15.9)
MCH: 31.4 pg (ref 26.6–33.0)
MCHC: 32.4 g/dL (ref 31.5–35.7)
MCV: 97 fL (ref 79–97)
Platelets: 164 x10E3/uL (ref 150–450)
RBC: 3.92 x10E6/uL (ref 3.77–5.28)
RDW: 12.3 % (ref 11.7–15.4)
WBC: 7.6 x10E3/uL (ref 3.4–10.8)

## 2023-12-05 LAB — HIV ANTIBODY (ROUTINE TESTING W REFLEX): HIV Screen 4th Generation wRfx: NONREACTIVE

## 2023-12-05 LAB — RPR: RPR Ser Ql: NONREACTIVE

## 2023-12-06 ENCOUNTER — Other Ambulatory Visit

## 2023-12-06 DIAGNOSIS — Z3A31 31 weeks gestation of pregnancy: Secondary | ICD-10-CM

## 2023-12-06 DIAGNOSIS — Z3480 Encounter for supervision of other normal pregnancy, unspecified trimester: Secondary | ICD-10-CM

## 2023-12-07 LAB — GLUCOSE TOLERANCE, 2 HOURS W/ 1HR
Glucose, 1 hour: 103 mg/dL (ref 70–179)
Glucose, 2 hour: 68 mg/dL — ABNORMAL LOW (ref 70–152)
Glucose, Fasting: 83 mg/dL (ref 70–91)

## 2023-12-09 ENCOUNTER — Ambulatory Visit: Payer: Self-pay | Admitting: Obstetrics and Gynecology

## 2023-12-09 DIAGNOSIS — Z3480 Encounter for supervision of other normal pregnancy, unspecified trimester: Secondary | ICD-10-CM

## 2023-12-14 ENCOUNTER — Ambulatory Visit: Attending: Obstetrics and Gynecology | Admitting: Maternal & Fetal Medicine

## 2023-12-14 ENCOUNTER — Ambulatory Visit (HOSPITAL_BASED_OUTPATIENT_CLINIC_OR_DEPARTMENT_OTHER)

## 2023-12-14 VITALS — BP 133/65 | HR 85

## 2023-12-14 DIAGNOSIS — O99210 Obesity complicating pregnancy, unspecified trimester: Secondary | ICD-10-CM

## 2023-12-14 DIAGNOSIS — Z362 Encounter for other antenatal screening follow-up: Secondary | ICD-10-CM | POA: Insufficient documentation

## 2023-12-14 DIAGNOSIS — Z148 Genetic carrier of other disease: Secondary | ICD-10-CM | POA: Diagnosis not present

## 2023-12-14 DIAGNOSIS — Z3480 Encounter for supervision of other normal pregnancy, unspecified trimester: Secondary | ICD-10-CM

## 2023-12-14 DIAGNOSIS — O09893 Supervision of other high risk pregnancies, third trimester: Secondary | ICD-10-CM | POA: Insufficient documentation

## 2023-12-14 DIAGNOSIS — Z3A32 32 weeks gestation of pregnancy: Secondary | ICD-10-CM | POA: Insufficient documentation

## 2023-12-14 DIAGNOSIS — O09899 Supervision of other high risk pregnancies, unspecified trimester: Secondary | ICD-10-CM | POA: Diagnosis present

## 2023-12-14 NOTE — Progress Notes (Signed)
 After review, MFM consult with provider is not indicated for today  William Glenn, DO 12/14/2023 4:05 PM  [facility]

## 2023-12-17 ENCOUNTER — Encounter: Admitting: Advanced Practice Midwife

## 2023-12-24 ENCOUNTER — Encounter: Admitting: Obstetrics and Gynecology

## 2024-01-01 ENCOUNTER — Encounter: Payer: Self-pay | Admitting: Obstetrics and Gynecology

## 2024-01-02 ENCOUNTER — Encounter: Admitting: Obstetrics & Gynecology

## 2024-01-03 ENCOUNTER — Encounter: Admitting: Obstetrics and Gynecology

## 2024-01-07 ENCOUNTER — Encounter (HOSPITAL_COMMUNITY): Payer: Self-pay | Admitting: Obstetrics and Gynecology

## 2024-01-07 ENCOUNTER — Inpatient Hospital Stay (HOSPITAL_COMMUNITY)
Admission: AD | Admit: 2024-01-07 | Discharge: 2024-01-07 | Disposition: A | Discharge status: Home / Self Care | Attending: Obstetrics and Gynecology | Admitting: Obstetrics and Gynecology

## 2024-01-07 DIAGNOSIS — R109 Unspecified abdominal pain: Secondary | ICD-10-CM

## 2024-01-07 DIAGNOSIS — O26893 Other specified pregnancy related conditions, third trimester: Secondary | ICD-10-CM

## 2024-01-07 DIAGNOSIS — Z3480 Encounter for supervision of other normal pregnancy, unspecified trimester: Secondary | ICD-10-CM

## 2024-01-07 DIAGNOSIS — O4703 False labor before 37 completed weeks of gestation, third trimester: Secondary | ICD-10-CM | POA: Insufficient documentation

## 2024-01-07 DIAGNOSIS — O212 Late vomiting of pregnancy: Secondary | ICD-10-CM | POA: Insufficient documentation

## 2024-01-07 DIAGNOSIS — R0602 Shortness of breath: Secondary | ICD-10-CM | POA: Insufficient documentation

## 2024-01-07 DIAGNOSIS — O219 Vomiting of pregnancy, unspecified: Secondary | ICD-10-CM | POA: Diagnosis not present

## 2024-01-07 DIAGNOSIS — O26899 Other specified pregnancy related conditions, unspecified trimester: Secondary | ICD-10-CM

## 2024-01-07 DIAGNOSIS — Z3A35 35 weeks gestation of pregnancy: Secondary | ICD-10-CM | POA: Diagnosis not present

## 2024-01-07 LAB — WET PREP, GENITAL
Clue Cells Wet Prep HPF POC: NONE SEEN
Sperm: NONE SEEN
Trich, Wet Prep: NONE SEEN
WBC, Wet Prep HPF POC: <10 (ref <10)
Yeast Wet Prep HPF POC: NONE SEEN

## 2024-01-07 MED ORDER — ONDANSETRON 4 MG PO TBDP
4.0000 mg | ORAL_TABLET | Freq: Once | ORAL | Status: AC
  Administered 2024-01-07: 4 mg via ORAL
  Filled 2024-01-07: qty 1

## 2024-01-07 NOTE — Discharge Instructions (Signed)
 Nichole Finley,  You came to the MAU (Maternity Assessment Unit) today for abdominal pain and cramping. We did a cervical check for you, which did not show evidence of labor. We also kept an eye on baby via a monitor, who looked good throughout your stay. We did some labs on you, which so far came back negative. Your gonorrhea/chlamydia test is still pending; results will be available in MyChart in the next couple days, and we will reach out if it is abnormal.  Reasons to return to the MAU: - You start to experience abdominal pain/contractions (squeezing sensation) - You experience vaginal bleeding or leakage of fluids - You do not feel your baby moving as much or at all - You develop a fever (> 100.87F or 38C)  Thank you for allowing me to be a part of your care! Alan Flies, MD [facility], PGY1 [facility] at [facility] [address] [place], [place] [address]

## 2024-01-07 NOTE — MAU Note (Signed)
 Nichole Finley is a 23 y.o. at [redacted]w[redacted]d here in MAU reporting: pt arrived by EMS, encode was that she had a period like pain an hour ago. Vss.

## 2024-01-07 NOTE — MAU Provider Note (Signed)
 Chief Complaint:  Decreased Fetal Movement and Contractions   HPI    Nichole Finley is a 23 y.o. G3P2002 at [redacted]w[redacted]d who presents to maternity admissions reporting preterm labor, EDC of 06/18/23.  Period-like cramps starting last night (different from what her Darol Irving feels like); timed last night andn were ~3 min apart. Continuing today, not as frequent.  Her Darol Irving improve with drinking water, switching positions; current cramping not improved with this. Reports feels significant, can't talk through them easily. Also associated with pelvic pressure. Happening now a couple times an hour, lasting 2-3 minutes at a time. Associated with shortness of breath.  No precipitating factors. She Denies sexual activity within the past 96 hours, trauma. Denies risk factors for preterm birth including history of preterm birth, infection (systemic, periodontal, uterine, urinary, pelvic), cervical procedure (LEEP), uterine anomalies, and multiple gestation.  Also reports nausea. Has had nausea and vomiting throughout the pregnancy. Treated with Zofran  4 mg q8h prn, phenergan  25 mg q6h prn. Had improved, then returned last night. Now vomiting every time she uses the bathroom. No vomiting. Has not taken previously prescribed medications yet.  Vaginal bleeding: None  Fluid leakage: Unsure; had wetness/fluid in legs (unsure if urine) earlier, non currently; also with a sticky yellow discharge earlier  Fetal movement: Yes currently (decreased earlier)  Pregnancy Course: Receives care at [facility] for [employer] . Prenatal records reviewed.   Past Medical History:  Diagnosis Date   Depression    Vaginal Pap smear, abnormal    OB History  Gravida Para Term Preterm AB Living  3 2 2  0 0 2  SAB IAB Ectopic Multiple Live Births  0 0 0 0 2    # Outcome Date GA Lbr Len/2nd Weight Sex Type Anes PTL Lv  3 Current           2 Term 01/09/22 [redacted]w[redacted]d 07:52 / 00:37 3204 g F Vag-Spont EPI  LIV  1 Term  10/22/17 [redacted]w[redacted]d   M Vag-Spont   LIV   Past Surgical History:  Procedure Laterality Date   NO PAST SURGERIES     Family History  Problem Relation Age of Onset   Healthy Mother    Healthy Father    Heart disease Maternal Uncle    Diabetes Maternal Grandmother    Heart disease Maternal Grandfather    Asthma Neg Hx    Birth defects Neg Hx    Cancer Neg Hx    Stroke Neg Hx    Social History   Tobacco Use   Smoking status: Former    Current packs/day: 0.00    Types: Cigarettes    Quit date: 05/23/2017    Years since quitting: 6.6   Smokeless tobacco: Never   Tobacco comments:    before pregnancy  Vaping Use   Vaping status: Former   Substances: Flavoring   Devices: stopped when pregnancy confirmed  Substance Use Topics   Alcohol use: No   Drug use: No   No Known Allergies Medications Prior to Admission  Medication Sig Dispense Refill Last Dose/Taking   [Paused] Prenat-Fe Poly-Methfol-FA-DHA (VITAFOL  ULTRA) 29-0.6-0.4-200 MG CAPS Take 1 tablet by mouth daily. 30 capsule 12 01/07/2024   Blood Pressure Monitoring (BLOOD PRESSURE KIT) DEVI 1 Device by Does not apply route once a week. 1 each 0    ondansetron  (ZOFRAN ) 4 MG tablet Take 1 tablet (4 mg total) by mouth every 8 (eight) hours as needed for nausea or vomiting. 20 tablet 0  Prenatal Vit-Fe Fumarate-FA (PRENATAL VITAMIN PO) Take 1 tablet by mouth daily.      promethazine  (PHENERGAN ) 25 MG tablet Take 1 tablet (25 mg total) by mouth every 6 (six) hours as needed for nausea or vomiting. (Patient not taking: Reported on 08/22/2023) 30 tablet 1     I have reviewed patient's Past Medical Hx, Surgical Hx, Family Hx, Social Hx, medications and allergies.   ROS  Pertinent items noted in HPI and remainder of comprehensive ROS otherwise negative.   PHYSICAL EXAM  Patient Vitals for the past 24 hrs:  BP Temp Pulse Resp  01/07/24 1316 127/70 98.7 F (37.1 C) 85 18    Constitutional: Well-developed, well-nourished female in no  acute distress.  HEENT: atraumatic, normocephalic. Neck has normal ROM. EOM intact. Cardiovascular: normal rate & rhythm, warm and well-perfused Respiratory: normal effort, no problems with respiration noted GI: Abd soft, non-tender, gravid MSK: Extremities nontender, no edema, normal ROM Skin: warm and dry. Acyanotic, no jaundice or pallor. Neurologic: Alert and oriented x 4. No abnormal coordination. Psychiatric: Normal mood. Speech not slurred, not rapid/pressured. Patient is cooperative. Pelvic exam: VULVA: normal appearing vulva with no masses, tenderness or lesions; swabs collected; exam chaperoned by Tobey Cedar.      Fetal Tracing: Baseline FHR: 145 per minute Fetal heart variability: moderate Fetal Heart Rate accelerations: yes Fetal Heart Rate decelerations: none Fetal Non-stress Test: Category I (reactive) Toco: Occasional (>10 min apart)  Labs: Results for orders placed or performed during the hospital encounter of 01/07/24 (from the past 24 hours)  Wet prep, genital     Status: None   Collection Time: 01/07/24  2:20 PM   Specimen: PATH Cytology Cervicovaginal Ancillary Only  Result Value Ref Range   Yeast Wet Prep HPF POC NONE SEEN NONE SEEN   Trich, Wet Prep NONE SEEN NONE SEEN   Clue Cells Wet Prep HPF POC NONE SEEN NONE SEEN   WBC, Wet Prep HPF POC <10 <10   Sperm NONE SEEN     Imaging:  No results found.   MDM & MAU COURSE  MDM: Moderate  MAU Course: Differential diagnosis considered for abdominal pain includes but is not limited to: round ligament pain, labor, UTI, pyelonephritis, PID, cervicitis, placental abruption, chorioamnionitis Wet prep negative. GC/Chlamydia pending at time of discharge. Patient unable to void for urinalysis. Cervical check not suggestive of labor, patient reassured.  Differential diagnosis considered for nausea/vomiting includes but is not limited to: nausea due to pregnancy, hyperemesis gravidarum, viral infection,  gastroenteritis, cholecystitis, pancreatitis, ACS, food poisoning, diabetic ketoacidosis, drug ingestion, gastroparesis, alcohol/opiate withdrawal Reassuring physical exam. Patient able to tolerate po fluids. Given Zofran  ODT 4 mg once with significant improvement. Patient can continue taking prn Zofran , phenergan  as needed for N/V; already has this at home.  Orders Placed This Encounter  Procedures   Wet prep, genital   Discharge patient   Meds ordered this encounter  Medications   ondansetron  (ZOFRAN -ODT) disintegrating tablet 4 mg    ASSESSMENT   1. Abdominal pain affecting pregnancy   2. Supervision of other normal pregnancy, antepartum   3. Nausea and vomiting during pregnancy   4. [redacted] weeks gestation of pregnancy     PLAN  Discharge home in stable condition with return precautions.  GC/Chlamydia pending, will reach out to patient if results abnormal.     Allergies as of 01/07/2024   No Known Allergies      Medication List     PAUSE taking these medications  Vitafol  Ultra 29-0.6-0.4-200 MG Caps Wait to take this until your doctor or other care provider tells you to start again. Take 1 tablet by mouth daily.       TAKE these medications    Blood Pressure Kit Devi 1 Device by Does not apply route once a week.   ondansetron  4 MG tablet Commonly known as: ZOFRAN  Take 1 tablet (4 mg total) by mouth every 8 (eight) hours as needed for nausea or vomiting.   PRENATAL VITAMIN PO Take 1 tablet by mouth daily.   promethazine  25 MG tablet Commonly known as: PHENERGAN  Take 1 tablet (25 mg total) by mouth every 6 (six) hours as needed for nausea or vomiting.        Alan Flies, MD

## 2024-01-07 NOTE — MAU Note (Addendum)
 Nichole Finley is a 23 y.o. at [redacted]w[redacted]d here in MAU reporting: she has had braxton hicks cts before but last night they felt stronger and more painful. Stated they continues today and is feeling them every few minutes . Repots a yellow mucusy discharge when she wipes but no leaking. Also reports decreased fetal movement today. Stated she is feeling a little nauseated and would like something for that.   LMP:  Onset of complaint: last night Pain score: 5-6 Vitals:   01/07/24 1316  BP: 127/70  Pulse: 85  Resp: 18  Temp: 98.7 F (37.1 C)     FHT: 150  Lab orders placed from triage: u/a

## 2024-01-08 ENCOUNTER — Ambulatory Visit: Payer: Self-pay | Admitting: Obstetrics and Gynecology

## 2024-01-08 LAB — GC/CHLAMYDIA PROBE AMP (~~LOC~~) NOT AT ARMC
Chlamydia: NEGATIVE
Comment: NEGATIVE
Comment: NORMAL
Neisseria Gonorrhea: NEGATIVE

## 2024-01-15 ENCOUNTER — Inpatient Hospital Stay (HOSPITAL_COMMUNITY)
Admission: AD | Admit: 2024-01-15 | Discharge: 2024-01-17 | DRG: 807 | Disposition: A | Discharge status: Home / Self Care | Attending: Obstetrics and Gynecology | Admitting: Obstetrics and Gynecology

## 2024-01-15 ENCOUNTER — Encounter: Payer: Self-pay | Admitting: Advanced Practice Midwife

## 2024-01-15 DIAGNOSIS — Z148 Genetic carrier of other disease: Secondary | ICD-10-CM | POA: Diagnosis not present

## 2024-01-15 DIAGNOSIS — O99214 Obesity complicating childbirth: Secondary | ICD-10-CM | POA: Diagnosis present

## 2024-01-15 DIAGNOSIS — Z833 Family history of diabetes mellitus: Secondary | ICD-10-CM

## 2024-01-15 DIAGNOSIS — O4202 Full-term premature rupture of membranes, onset of labor within 24 hours of rupture: Secondary | ICD-10-CM | POA: Diagnosis not present

## 2024-01-15 DIAGNOSIS — Z3A36 36 weeks gestation of pregnancy: Secondary | ICD-10-CM

## 2024-01-15 DIAGNOSIS — Z8249 Family history of ischemic heart disease and other diseases of the circulatory system: Secondary | ICD-10-CM

## 2024-01-15 DIAGNOSIS — Z87891 Personal history of nicotine dependence: Secondary | ICD-10-CM

## 2024-01-15 DIAGNOSIS — Z23 Encounter for immunization: Secondary | ICD-10-CM | POA: Diagnosis not present

## 2024-01-15 DIAGNOSIS — O26893 Other specified pregnancy related conditions, third trimester: Secondary | ICD-10-CM | POA: Diagnosis present

## 2024-01-15 DIAGNOSIS — Z3490 Encounter for supervision of normal pregnancy, unspecified, unspecified trimester: Principal | ICD-10-CM

## 2024-01-15 LAB — TYPE AND SCREEN
ABO/RH(D): B POS
Antibody Screen: NEGATIVE

## 2024-01-15 MED ORDER — LIDOCAINE HCL (PF) 1 % IJ SOLN
30.0000 mL | INTRAMUSCULAR | Status: DC | PRN
  Filled 2024-01-15: qty 30

## 2024-01-15 MED ORDER — OXYTOCIN-SODIUM CHLORIDE 30-0.9 UT/500ML-% IV SOLN
2.5000 [IU]/h | INTRAVENOUS | Status: DC
  Administered 2024-01-16: 2.5 [IU]/h via INTRAVENOUS
  Filled 2024-01-15: qty 500

## 2024-01-15 MED ORDER — SODIUM CHLORIDE 0.9 % IV SOLN: 5.0000 10*6.[IU] | Freq: Once | INTRAVENOUS | Status: DC

## 2024-01-15 MED ORDER — OXYCODONE-ACETAMINOPHEN 5-325 MG PO TABS: 2.0000 | ORAL_TABLET | ORAL | Status: DC | PRN

## 2024-01-15 MED ORDER — EPHEDRINE 5 MG/ML INJ: 10.0000 mg | INTRAVENOUS | Status: DC | PRN

## 2024-01-15 MED ORDER — OXYTOCIN BOLUS FROM INFUSION
333.0000 mL | Freq: Once | INTRAVENOUS | Status: AC
  Administered 2024-01-15: 333 mL via INTRAVENOUS

## 2024-01-15 MED ORDER — LACTATED RINGERS IV SOLN: 500.0000 mL | Freq: Once | INTRAVENOUS | Status: DC

## 2024-01-15 MED ORDER — SODIUM CHLORIDE 0.9 % IV SOLN: 2.0000 g | Freq: Four times a day (QID) | INTRAVENOUS | Status: DC

## 2024-01-15 MED ORDER — DIPHENHYDRAMINE HCL 50 MG/ML IJ SOLN: 12.5000 mg | INTRAMUSCULAR | Status: DC | PRN

## 2024-01-15 MED ORDER — SOD CITRATE-CITRIC ACID 500-334 MG/5ML PO SOLN: 30.0000 mL | ORAL | Status: DC | PRN

## 2024-01-15 MED ORDER — OXYCODONE-ACETAMINOPHEN 5-325 MG PO TABS: 1.0000 | ORAL_TABLET | ORAL | Status: DC | PRN

## 2024-01-15 MED ORDER — FENTANYL-BUPIVACAINE-NACL 0.5-0.125-0.9 MG/250ML-% EP SOLN: 12.0000 mL/h | EPIDURAL | Status: DC | PRN

## 2024-01-15 MED ORDER — ACETAMINOPHEN 325 MG PO TABS: 650.0000 mg | ORAL_TABLET | ORAL | Status: DC | PRN

## 2024-01-15 MED ORDER — LACTATED RINGERS IV SOLN: 500.0000 mL | INTRAVENOUS | Status: DC | PRN

## 2024-01-15 MED ORDER — SODIUM CHLORIDE 0.9 % IV SOLN
INTRAVENOUS | Status: AC
  Filled 2024-01-15: qty 2000

## 2024-01-15 MED ORDER — SODIUM CHLORIDE 0.9 % IV SOLN
2.0000 g | Freq: Once | INTRAVENOUS | Status: AC
  Administered 2024-01-15: 2 g via INTRAVENOUS

## 2024-01-15 MED ORDER — ONDANSETRON HCL 4 MG/2ML IJ SOLN: 4.0000 mg | Freq: Four times a day (QID) | INTRAMUSCULAR | Status: DC | PRN

## 2024-01-15 MED ORDER — SODIUM CHLORIDE 0.9 % IV SOLN
1.0000 g | INTRAVENOUS | Status: DC
  Filled 2024-01-15 (×4): qty 1000

## 2024-01-15 MED ORDER — PHENYLEPHRINE 80 MCG/ML (10ML) SYRINGE FOR IV PUSH (FOR BLOOD PRESSURE SUPPORT): 80.0000 ug | PREFILLED_SYRINGE | INTRAVENOUS | Status: DC | PRN

## 2024-01-15 MED ORDER — LACTATED RINGERS IV SOLN: INTRAVENOUS | Status: DC

## 2024-01-15 NOTE — Discharge Summary (Signed)
 Postpartum Discharge Summary  Date of Service updated***     Patient Name: Nichole Finley DOB: 06-Jun-2000 MRN: [id number]  Date of admission: 01/15/2024 Delivery date:01/15/2024 Delivering provider: PAYNE, SHANTONETTE M Date of discharge: 01/15/2024  Admitting diagnosis: Pregnancy [Z34.90] Intrauterine pregnancy: [redacted]w[redacted]d     Secondary diagnosis:  Principal Problem:   Pregnancy  Additional problems:  Patient Active Problem List   Diagnosis Date Noted   Pregnancy 01/15/2024   Supervision of other normal pregnancy, antepartum 07/11/2023   Carrier of spinal muscular atrophy 07/11/2021   Maternal obesity affecting pregnancy, antepartum 06/13/2021   False positive HIV serology 03/03/2017   Severe single current episode of major depressive disorder, without psychotic features (HCC)    MDD (major depressive disorder), single episode, moderate (HCC) 10/06/2014   Attention deficit hyperactivity disorder (ADHD), combined type, severe 10/06/2014   Oppositional defiant disorder 10/06/2014       Discharge diagnosis: {DX.:23714}                                              Post partum procedures:{Postpartum procedures:23558} Augmentation: N/A Complications: None  Hospital course: Onset of Labor With Vaginal Delivery      23 y.o. yo [id number] at [redacted]w[redacted]d was admitted in Active Labor on 01/15/2024. Labor course was complicated by Preterm labor  Membrane Rupture Time/Date: 11:04 PM,01/15/2024  Delivery Method:Vaginal, Spontaneous Operative Delivery:N/A Episiotomy: None Lacerations:  None Patient had a postpartum course complicated by ***.  She is ambulating, tolerating a regular diet, passing flatus, and urinating well. Patient is discharged home in stable condition on 01/15/24.  Newborn Data: Birth date:01/15/2024 Birth time:11:16 PM Gender:Female Living status:Living Apgars:9 ,9  Weight:   Magnesium Sulfate received: No BMZ received: No Rhophylac:N/A MMR:N/A T-DaP:Given prenatally Flu:  No RSV Vaccine received: No Transfusion:{Transfusion received:30440034}  Immunizations received: Immunization History  Administered Date(s) Administered   Influenza,inj,Quad PF,6+ Mos 03/10/2015, 07/13/2017, 06/13/2021   Tdap 08/08/2017, 10/14/2021, 12/03/2023    Physical exam  There were no vitals filed for this visit. General: {Exam; general:21111117} Lochia: {Desc; appropriate/inappropriate:30686::appropriate} Uterine Fundus: {Desc; firm/soft:30687} Incision: {Exam; incision:21111123} DVT Evaluation: {Exam; dvt:2111122} Labs: Lab Results  Component Value Date   WBC 7.6 12/03/2023   HGB 12.3 12/03/2023   HCT 38.0 12/03/2023   MCV 97 12/03/2023   PLT 164 12/03/2023      Latest Ref Rng & Units 06/18/2023    7:25 PM  CMP  Glucose 70 - 99 mg/dL 80   BUN 6 - 20 mg/dL 9   Creatinine 9.55 - 8.99 mg/dL 9.22   Sodium 864 - 854 mmol/L 136   Potassium 3.5 - 5.1 mmol/L 3.5   Chloride 98 - 111 mmol/L 104   CO2 22 - 32 mmol/L 23   Calcium 8.9 - 10.3 mg/dL 9.1   Total Protein 6.5 - 8.1 g/dL 6.6   Total Bilirubin 0.0 - 1.2 mg/dL 0.3   Alkaline Phos 38 - 126 U/L 49   AST 15 - 41 U/L 17   ALT 0 - 44 U/L 14    Edinburgh Score:    01/10/2022    9:05 AM  Edinburgh Postnatal Depression Scale Screening Tool  I have been able to laugh and see the funny side of things. 0  I have looked forward with enjoyment to things. 1  I have blamed myself unnecessarily when things went wrong. 0  I  have been anxious or worried for no good reason. 2  I have felt scared or panicky for no good reason. 0  Things have been getting on top of me. 1  I have been so unhappy that I have had difficulty sleeping. 1  I have felt sad or miserable. 1  I have been so unhappy that I have been crying. 1  The thought of harming myself has occurred to me. 0  Edinburgh Postnatal Depression Scale Total 7      Data saved with a previous flowsheet row definition   No data recorded  After visit meds:  Allergies  as of 01/15/2024   No Known Allergies   Med Rec must be completed prior to using this [facility]***        Discharge home in stable condition Infant Feeding: {Baby feeding:23562} Infant Disposition:{CHL IP OB HOME WITH FNUYZM:76418} Discharge instruction: per After Visit Summary and Postpartum booklet. Activity: Advance as tolerated. Pelvic rest for 6 weeks.  Diet: {OB ipzu:78888878} Future Appointments: Future Appointments  Date Time Provider Department Center  01/21/2024  3:50 PM Rudy Carlin LABOR, MD [facility] None   Follow up Visit:   Please schedule this patient for a In person postpartum visit in 4 weeks with the following provider: Any provider. Additional Postpartum F/U:N/A   Low risk pregnancy complicated by: N/A  Delivery mode:  Vaginal, Spontaneous Anticipated Birth Control:  Nexplanon  Message sent on 01/15/24  01/15/2024 [name], CNM

## 2024-01-15 NOTE — H&P (Signed)
 Nichole Finley is a 23 y.o. female presenting via EMS for SOL. Patient reports contractions every 2-5 mins. Membranes intact.  OB History     Gravida  3   Para  2   Term  2   Preterm  0   AB  0   Living  2      SAB  0   IAB  0   Ectopic  0   Multiple  0   Live Births  2          Past Medical History:  Diagnosis Date   Depression    Vaginal Pap smear, abnormal    Past Surgical History:  Procedure Laterality Date   NO PAST SURGERIES     Family History: family history includes Diabetes in her maternal grandmother; Healthy in her father and mother; Heart disease in her maternal grandfather and maternal uncle. Social History:  reports that she quit smoking about 6 years ago. Her smoking use included cigarettes. She has never used smokeless tobacco. She reports that she does not drink alcohol and does not use drugs.      NURSING  PROVIDER  Office Location [facility] Dating by  Early US  C/W LMP at [redacted]w[redacted]d  [facility] Model Traditional Anatomy U/S    Initiated care at  10wks                 Language  English               LAB RESULTS   Support Person  FOB Genetics NIPS: LR female  AFP:       NT/IT (FT only)        Carrier Screen Horizon:   Rhogam  B/Positive/-- (03/12 1204) A1C/GTT Early HgbA1C:  Third trimester 2 hr GTT: normal   Flu Vaccine  No      TDaP Vaccine  12/03/2023 Blood Type B/Positive/-- (03/12 1204)  RSV Vaccine   Antibody Negative (03/12 1204)  COVID Vaccine  No Rubella 3.46 (03/12 1204)  Feeding Plan breast RPR Non Reactive (03/12 1204)  Contraception NEXPLANON HBsAg Negative (03/12 1204)  Circumcision Yes  HIV Non Reactive (03/12 1204)  Pediatrician  [facility] HCVAb Non Reactive (03/12 1204)  Prenatal Classes        BTL Consent   Pap       Diagnosis  Date Value Ref Range Status  06/13/2021 (A)   Final    - Atypical squamous cells of undetermined significance (ASC-US )    BTL Pre-payment   GC/CT Initial:   36wks:    VBAC Consent NA GBS For  PCN allergy, check sensitivities   BRx Optimized? [X]  yes   [ ]  no      DME Rx [X]  BP cuff [ ]  Weight Scale Waterbirth  [ ]  Class [ ]  Consent [ ]  CNM visit  PHQ9 & GAD7 [X]  new OB [X]  28 weeks  [  ] 36 weeks Induction  [ ]  Orders Entered [ ] Foley Y/N     Review of Systems Maternal Medical History:  Reason for admission: Contractions.   Contractions: Perceived severity is strong.   Fetal activity: Perceived fetal activity is normal.   Prenatal complications: no prenatal complications Prenatal Complications - Diabetes: none.   Dilation: 8 Exam by:: CANDIE Cedar, CNM Last menstrual period 05/02/2023. Maternal Exam:  Uterine Assessment: Contraction strength is firm.  Contraction duration is 1 minute. Contraction frequency is regular.  Abdomen: Patient reports no abdominal tenderness. Fetal presentation: vertex Pelvis:  adequate for delivery.   Cervix: Cervix evaluated by digital exam.     Fetal Exam Fetal Monitor Review: Baseline rate: 135 in MAU.  Variability: moderate (6-25 bpm).     Physical Exam Vitals and nursing note reviewed. Exam conducted with a chaperone present.  Constitutional:      General: She is not in acute distress.    Appearance: Normal appearance.  HENT:     Head: Normocephalic.  Pulmonary:     Effort: Pulmonary effort is normal.  Genitourinary:    Comments: Dilation: 8 Exam by:: CANDIE Cedar, CNM  Musculoskeletal:     Cervical back: Normal range of motion.  Skin:    General: Skin is warm and dry.  Neurological:     Mental Status: She is alert and oriented to person, place, and time.  Psychiatric:        Mood and Affect: Mood normal.     Prenatal labs: ABO, Rh: B/Positive/-- (03/12 1204) Antibody: Negative (03/12 1204) Rubella: 3.46 (03/12 1204) RPR: Non Reactive (08/04 0911)  HBsAg: Negative (03/12 1204)  HIV: Non Reactive (08/04 0911)  GBS:   PCR ordered, plan for Amp 2 gram  Assessment/Plan: Nichole Finley , a  23 y.o. [id number] at [redacted]w[redacted]d presents  for SOL   Labor: expectant management  FWB: cat I  GBS unknown, Amp 2 gram ordered  MOF: Breast  MOC: Nexplanon  Circ: Yes  MOD: Hopeful for vaginal delivery.    [name], CNM  01/15/2024, 10:36 PM

## 2024-01-15 NOTE — MAU Note (Signed)
 Arrival via EMS from home with report of contractions every 2-3 minutes, denies ROM or bleeding. Reports last vaginal exam was in mau and she was 1-2. SVE per Emilio, CNM 8cm, report to L/D and pt transported to room 215 per stretcher with provider at bedside. FHT's 130's-160's prior to transfer.

## 2024-01-16 ENCOUNTER — Encounter (HOSPITAL_COMMUNITY): Payer: Self-pay | Admitting: Obstetrics and Gynecology

## 2024-01-16 LAB — CBC
HCT: 38.5 % (ref 36.0–46.0)
HCT: 33.6 % — ABNORMAL LOW (ref 36.0–46.0)
Hemoglobin: 13.5 g/dL (ref 12.0–15.0)
Hemoglobin: 11.9 g/dL — ABNORMAL LOW (ref 12.0–15.0)
MCH: 31.5 pg (ref 26.0–34.0)
MCH: 31.2 pg (ref 26.0–34.0)
MCHC: 35.1 g/dL (ref 30.0–36.0)
MCHC: 35.4 g/dL (ref 30.0–36.0)
MCV: 89.7 fL (ref 80.0–100.0)
MCV: 88.2 fL (ref 80.0–100.0)
Platelets: 217 K/uL (ref 150–400)
Platelets: 167 K/uL (ref 150–400)
RBC: 4.29 MIL/uL (ref 3.87–5.11)
RBC: 3.81 MIL/uL — ABNORMAL LOW (ref 3.87–5.11)
RDW: 12.2 % (ref 11.5–15.5)
RDW: 12.2 % (ref 11.5–15.5)
WBC: 11 K/uL — ABNORMAL HIGH (ref 4.0–10.5)
WBC: 11.9 K/uL — ABNORMAL HIGH (ref 4.0–10.5)
nRBC: 0 % (ref 0.0–0.2)
nRBC: 0 % (ref 0.0–0.2)

## 2024-01-16 LAB — RPR: RPR Ser Ql: NONREACTIVE

## 2024-01-16 MED ORDER — DIBUCAINE (PERIANAL) 1 % EX OINT: 1.0000 | TOPICAL_OINTMENT | CUTANEOUS | Status: DC | PRN

## 2024-01-16 MED ORDER — ZOLPIDEM TARTRATE 5 MG PO TABS: 5.0000 mg | ORAL_TABLET | Freq: Every evening | ORAL | Status: DC | PRN

## 2024-01-16 MED ORDER — WITCH HAZEL-GLYCERIN EX PADS: 1.0000 | MEDICATED_PAD | CUTANEOUS | Status: DC | PRN

## 2024-01-16 MED ORDER — TETANUS-DIPHTH-ACELL PERTUSSIS 5-2.5-18.5 LF-MCG/0.5 IM SUSY: 0.5000 mL | PREFILLED_SYRINGE | Freq: Once | INTRAMUSCULAR | Status: DC

## 2024-01-16 MED ORDER — BENZOCAINE-MENTHOL 20-0.5 % EX AERO
1.0000 | INHALATION_SPRAY | CUTANEOUS | Status: DC | PRN
  Administered 2024-01-16: 1 via TOPICAL
  Filled 2024-01-16: qty 56

## 2024-01-16 MED ORDER — PRENATAL MULTIVITAMIN CH
1.0000 | ORAL_TABLET | Freq: Every day | ORAL | Status: DC
  Administered 2024-01-16 – 2024-01-17 (×2): 1 via ORAL
  Filled 2024-01-16 (×2): qty 1

## 2024-01-16 MED ORDER — DIPHENHYDRAMINE HCL 25 MG PO CAPS: 25.0000 mg | ORAL_CAPSULE | Freq: Four times a day (QID) | ORAL | Status: DC | PRN

## 2024-01-16 MED ORDER — ONDANSETRON HCL 4 MG PO TABS: 4.0000 mg | ORAL_TABLET | ORAL | Status: DC | PRN

## 2024-01-16 MED ORDER — SIMETHICONE 80 MG PO CHEW: 80.0000 mg | CHEWABLE_TABLET | ORAL | Status: DC | PRN

## 2024-01-16 MED ORDER — ACETAMINOPHEN 325 MG PO TABS: 650.0000 mg | ORAL_TABLET | ORAL | Status: DC | PRN

## 2024-01-16 MED ORDER — IBUPROFEN 600 MG PO TABS
600.0000 mg | ORAL_TABLET | Freq: Four times a day (QID) | ORAL | Status: DC
  Administered 2024-01-16 – 2024-01-17 (×5): 600 mg via ORAL
  Filled 2024-01-16 (×5): qty 1

## 2024-01-16 MED ORDER — SENNOSIDES-DOCUSATE SODIUM 8.6-50 MG PO TABS
2.0000 | ORAL_TABLET | Freq: Every day | ORAL | Status: DC
  Administered 2024-01-17: 2 via ORAL
  Filled 2024-01-16: qty 2

## 2024-01-16 MED ORDER — INFLUENZA VIRUS VACC SPLIT PF (FLUZONE) 0.5 ML IM SUSY
0.5000 mL | PREFILLED_SYRINGE | INTRAMUSCULAR | Status: AC
  Administered 2024-01-17: 0.5 mL via INTRAMUSCULAR
  Filled 2024-01-16: qty 0.5

## 2024-01-16 MED ORDER — COCONUT OIL OIL: 1.0000 | TOPICAL_OIL | Status: DC | PRN

## 2024-01-16 MED ORDER — ONDANSETRON HCL 4 MG/2ML IJ SOLN: 4.0000 mg | INTRAMUSCULAR | Status: DC | PRN

## 2024-01-16 NOTE — Patient Instructions (Signed)
 If interested in an outpatient lactation consult in office or virtually please reach out to us  at [facility] (First Floor) [address], [place] Please call [phone] and press [phone] for lactation.   -- Lactation support groups:  [facility], Tuesdays 10:00 am -12:00 pm at [address] on the second floor in the conference room, lactating parents and lap babies welcome.  [email or web address]  [email or web address]    Nichole Finley, [facility] for [facility]

## 2024-01-16 NOTE — Progress Notes (Signed)
 POSTPARTUM PROGRESS NOTE  Postpartum Day 1  Subjective: Geanie Pacifico is a 23 y.o. [phone] s/p NVD at [redacted]w[redacted]d.  She reports she is doing well. No acute events overnight. She denies any problems with ambulating, voiding or po intake. Denies nausea or vomiting.  Pain is well controlled.  Lochia is appropriate.  Objective: Blood pressure 112/61, pulse 63, temperature 97.9 F (36.6 C), temperature source Oral, resp. rate 16, last menstrual period 05/02/2023, SpO2 100%, unknown if currently breastfeeding.  Physical Exam:  General: alert, cooperative and no distress Heart:regular rate Resp: nonlabored Uterine Fundus: firm, appropriately tender Extremities:  none edema Skin: warm, dry  Recent Labs    01/15/24 2205 01/16/24 0539  HGB 13.5 11.9*  HCT 38.5 33.6*    Assessment/Plan: Angelica Frandsen is a 23 y.o. [phone] s/p NVD at [redacted]w[redacted]d   PPD#1 - meeting milestones - continue routine postpartum care  Feeding: breast Contraception: Nexplanon OP   Dispo: Plan for discharge PPD2.  LOS: 1 day   Barabara Maier, DO FMOB Fellow, Faculty [facility], Center for [employer]

## 2024-01-16 NOTE — Lactation Note (Signed)
 This note was copied from a baby's chart. Lactation Consultation Note  Patient Name: Nichole Finley Unijb'd Date: 01/16/2024 Age:25 hours Reason for consult: Initial assessment;Early term 37-38.6wks  P1- MOB's feeding plan is to offer both breast milk and formula. MOB reports that infant latches great and has no issues. MOB also reported that infant was struggling with the formula, but after seeing speech and using a slower nipple, infant's bottle feedings have been much better. MOB denies having any questions or concerns at this time. MOB's RN set her up with the hospital DEBP. LC reviewed how to use it and how to clean it. MOB declines flange sizing reporting that the flange fits well.   LC reviewed the first 24 hr birthday nap, day 2 cluster feeding, feeding infant on cue 8-12x in 24 hrs, not allowing infant to go over 3 hrs without a feeding, CDC milk storage guidelines, LC services handout and engorgement/breast care. LC encouraged MOB to call for further assistance as needed.  Maternal Data Has patient been taught Hand Expression?: Yes Does the patient have breastfeeding experience prior to this delivery?: Yes How long did the patient breastfeed?: both older children for 1 year, no issues  Feeding Mother's Current Feeding Choice: Breast Milk and Formula Nipple Type: Nfant Slow Flow (purple)  Lactation Tools Discussed/Used Tools: Pump;Flanges Breast pump type: Double-Electric Breast Pump;Manual Pump Education: Setup, frequency, and cleaning;Milk Storage Reason for Pumping: LPI Pumping frequency: 15-20 min every 3 hrs  Interventions Interventions: Breast feeding basics reviewed;Hand pump;DEBP;Education;Pace feeding;LC Services brochure  Discharge Discharge Education: Engorgement and breast care;Warning signs for feeding baby Pump: Hands Free;Personal  Consult Status Consult Status: Complete Date: 01/16/24    Recardo Hoit BS, IBCLC 01/16/2024, 8:15 PM

## 2024-01-16 NOTE — Progress Notes (Signed)
 CSW received and acknowledges consult for EDPS of 9.  Consult screened out due to 9 on EDPS does not warrant a CSW consult.  MOB whom scores are greater than 9/yes to question 10 on [place] Postpartum Depression Screen warrants a CSW consult.   Enos Fling, Theresia Majors Clinical Social Worker [phone]

## 2024-01-16 NOTE — Progress Notes (Signed)
 MOB was referred for history of depression/anxiety.  * Referral screened out by Clinical Social Worker because none of the following criteria appear to apply:  ~ History of anxiety/depression during this pregnancy, or of post-partum depression following prior delivery.  ~ Diagnosis of anxiety and/or depression within last 3 years  Per OB notes, MOB did not indicate any signs/symptoms during her pregnancy.  Depression/Anxiety 2016  OR  * MOB's symptoms currently being treated with medication and/or therapy.  Please contact the Clinical Social Worker if needs arise, by [facility] request, or if MOB scores greater than 9/yes to question 10 on [place] Postpartum Depression Screen.  Rosina Molt, [place] Clinical Social Worker [phone]

## 2024-01-17 MED ORDER — IBUPROFEN 600 MG PO TABS: 600.0000 mg | ORAL_TABLET | Freq: Four times a day (QID) | ORAL | Status: AC

## 2024-01-17 MED ORDER — ACETAMINOPHEN 325 MG PO TABS: 650.0000 mg | ORAL_TABLET | ORAL | Status: AC | PRN

## 2024-01-21 ENCOUNTER — Encounter: Admitting: Obstetrics

## 2024-01-22 ENCOUNTER — Ambulatory Visit: Admitting: Obstetrics & Gynecology

## 2024-01-24 ENCOUNTER — Telehealth (HOSPITAL_COMMUNITY): Payer: Self-pay

## 2024-01-24 NOTE — Telephone Encounter (Signed)
 01/24/2024 1959  Name: Nichole Finley MRN: [id number] DOB: 26-Jul-2000  Reason for Call:  Transition of Care Hospital Discharge Call  Contact Status: Patient Contact Status: Unable to contact (No answer, mailbox is full.)  Language assistant needed:          Follow-Up Questions:    [place] Postnatal Depression Scale:  In the Past 7 Days:    PHQ2-9 Depression Scale:     Discharge Follow-up:    Post-discharge interventions: NA  Signature  Rosaline Deretha PEAK

## 2024-02-19 ENCOUNTER — Ambulatory Visit: Admitting: Advanced Practice Midwife

## 2024-04-09 ENCOUNTER — Ambulatory Visit: Payer: Self-pay | Admitting: Licensed Clinical Social Worker

## 2024-04-09 ENCOUNTER — Telehealth: Payer: Self-pay | Admitting: Licensed Clinical Social Worker

## 2024-04-09 DIAGNOSIS — R69 Illness, unspecified: Secondary | ICD-10-CM

## 2024-04-09 NOTE — BH Specialist Note (Signed)
 Spoke to [employer] who is from [employer]- Van is a score of 17. Patient stated that she does think about harming herself- patient answered sometimes. Asked patient a question if she wished she was dead or did not wake up, patient answered yes. Patient has passive thoughts but not active thoughts. [facility] will reach out to patient to schedule [facility] appointment.

## 2024-04-09 NOTE — Telephone Encounter (Signed)
 [facility] contacted patient on this date to schedule [facility] appointment. [facility] could not leave a message as the VM box was full.

## 2024-05-27 ENCOUNTER — Ambulatory Visit: Payer: Self-pay | Admitting: Licensed Clinical Social Worker

## 2024-05-27 DIAGNOSIS — F4320 Adjustment disorder, unspecified: Secondary | ICD-10-CM

## 2024-05-27 NOTE — BH Specialist Note (Signed)
 "   Integrated Behavioral Health via Telemedicine Visit  06/05/2024 Nichole Finley [id number]  Number of Integrated Behavioral Health Clinician visits: 1- Initial Visit  Session Start time: 1315   Session End time: 1332  Total time in minutes: 17    Referring Provider: Self Referred Patient/Family location: Home [facility] Provider location: Remote Office All persons participating in visit: Patient and [facility] Types of Service: Individual psychotherapy and Video visit  I connected with [name] and/or Clayborne Fecher's patient via  Psychologist, Clinical  (Video is Surveyor, mining) and verified that I am speaking with the correct person using two identifiers. Discussed confidentiality: Yes   I discussed the limitations of telemedicine and the availability of in person appointments.  Discussed there is a possibility of technology failure and discussed alternative modes of communication if that failure occurs.  I discussed that engaging in this telemedicine visit, they consent to the provision of behavioral healthcare and the services will be billed under their insurance.  Patient and/or legal guardian expressed understanding and consented to Telemedicine visit: Yes   Presenting Concerns: Patient and/or family reports the following symptoms/concerns: Increased depression and anxiety symptoms.  Duration of problem: Months; Severity of problem: moderate  Patient and/or Family's Strengths/Protective Factors: Concrete supports in place (healthy food, safe environments, etc.) and Physical Health (exercise, healthy diet, medication compliance, etc.)  Goals Addressed: Patient will:  Reduce symptoms of: anxiety, depression, and mood instability   Increase knowledge and/or ability of: coping skills and healthy habits   Demonstrate ability to: Increase healthy adjustment to current life circumstances and Increase adequate support systems for patient/family  Progress  towards Goals: Ongoing    Interventions: Interventions utilized:  Supportive Counseling and Supportive Reflection Standardized Assessments completed: Not Needed    Patient and/or Family Response: Patient was present for todays virtual session. She reports a history of being diagnosed around ages 25-17 with ADHD, depression, anxiety, and bipolar disorder, and reports previous treatment with Vyvanse  and other psychotropic medications, though she is unable to recall the specific names. Patient reports a recent increase in symptoms and scheduled todays appointment due to a desire to restart medication management. She reports increased impulsivity, irritability, and frequent anger, stating she has been lashing out without considering consequences. Patient was informed that the therapist is unable to prescribe medications and was provided with resources to connect with a psychiatrist and/or follow up with her primary care provider for medication evaluation. Patient ended the call following this discussion.  Clinical Assessment/Diagnosis  Adjustment disorder, unspecified type    Assessment: Patient currently experiencing increased irritability, anger, and impulsivity, with reported worsening of mood-related symptoms. She presents with distress related to unmanaged psychiatric symptoms and a desire to resume medication treatment..   Patient may benefit from continued support of integrated behavioral health services.  Plan: Follow up with behavioral health clinician on : No follow up appointment scheduled.  Behavioral recommendations: Patient is encouraged to follow up promptly with a psychiatrist or primary care provider for medication evaluation and management. Continued engagement in therapy and use of coping strategies to manage anger and impulsivity are recommended once care is reestablished. Referral(s): Integrated [employer] (In Clinic)  I discussed the assessment and  treatment plan with the patient and/or parent/guardian. They were provided an opportunity to ask questions and all were answered. They agreed with the plan and demonstrated an understanding of the instructions.   They were advised to call back or seek an in-person evaluation if the symptoms  worsen or if the condition fails to improve as anticipated.  Holdan Stucke LITTIE Seats, LCSWA "
